# Patient Record
Sex: Male | Born: 1955 | Race: White | Hispanic: No | Marital: Single | Smoking: Never smoker
Health system: Southern US, Community
[De-identification: ages and names within clinical notes are randomized; demographics above are authoritative.]

## PROBLEM LIST (undated history)

## (undated) DIAGNOSIS — M810 Age-related osteoporosis without current pathological fracture: Secondary | ICD-10-CM

## (undated) DIAGNOSIS — J449 Chronic obstructive pulmonary disease, unspecified: Secondary | ICD-10-CM

## (undated) DIAGNOSIS — E041 Nontoxic single thyroid nodule: Secondary | ICD-10-CM

## (undated) DIAGNOSIS — I679 Cerebrovascular disease, unspecified: Secondary | ICD-10-CM

## (undated) DIAGNOSIS — N189 Chronic kidney disease, unspecified: Secondary | ICD-10-CM

## (undated) DIAGNOSIS — F32A Depression, unspecified: Secondary | ICD-10-CM

## (undated) DIAGNOSIS — F1011 Alcohol abuse, in remission: Secondary | ICD-10-CM

## (undated) DIAGNOSIS — I499 Cardiac arrhythmia, unspecified: Secondary | ICD-10-CM

## (undated) DIAGNOSIS — I10 Essential (primary) hypertension: Secondary | ICD-10-CM

## (undated) DIAGNOSIS — I517 Cardiomegaly: Secondary | ICD-10-CM

## (undated) DIAGNOSIS — E785 Hyperlipidemia, unspecified: Secondary | ICD-10-CM

## (undated) DIAGNOSIS — F319 Bipolar disorder, unspecified: Secondary | ICD-10-CM

## (undated) DIAGNOSIS — F329 Major depressive disorder, single episode, unspecified: Secondary | ICD-10-CM

## (undated) HISTORY — PX: SPLENECTOMY: SUR1306

## (undated) HISTORY — PX: SPLENECTOMY, PARTIAL: SHX787

---

## 2003-08-10 ENCOUNTER — Inpatient Hospital Stay (HOSPITAL_COMMUNITY): Admission: EM | Admit: 2003-08-10 | Discharge: 2003-09-02 | Payer: Self-pay | Admitting: Emergency Medicine

## 2003-08-17 ENCOUNTER — Encounter (INDEPENDENT_AMBULATORY_CARE_PROVIDER_SITE_OTHER): Payer: Self-pay | Admitting: *Deleted

## 2003-08-19 ENCOUNTER — Encounter (INDEPENDENT_AMBULATORY_CARE_PROVIDER_SITE_OTHER): Payer: Self-pay | Admitting: *Deleted

## 2005-11-27 ENCOUNTER — Inpatient Hospital Stay (HOSPITAL_COMMUNITY): Admission: EM | Admit: 2005-11-27 | Discharge: 2005-11-30 | Payer: Self-pay | Admitting: Emergency Medicine

## 2008-08-09 ENCOUNTER — Emergency Department (HOSPITAL_COMMUNITY): Admission: EM | Admit: 2008-08-09 | Discharge: 2008-08-09 | Payer: Self-pay | Admitting: Emergency Medicine

## 2010-10-06 NOTE — H&P (Signed)
NAME:  Vincent Stephenson, Vincent Stephenson NO.:  [id number]   MEDICAL RECORD NO.:  [id number]          PATIENT TYPE:  EMS   LOCATION:  MAJO                         FACILITY:  [facility]   PHYSICIAN:  Vincent Stephenson, M.D.       DATE OF BIRTH:  1956-03-20   DATE OF ADMISSION:  11/27/2005  DATE OF DISCHARGE:                                HISTORY & PHYSICAL   PRIMARY CARE PHYSICIAN:  Vincent Stephenson.   CHIEF COMPLAINTS:  Fall.   HISTORY OF PRESENT ILLNESS:  Vincent Stephenson is a 55 year old gentleman with  alcohol induced dementia who has been residing in a skilled nursing facility  since 2005.  In 2005, he was found outside in the cold and was found to have  a gangrenous toe secondary to frostbite.  After that hospitalization, the  patient was declared mentally incompetent and Vincent Stephenson, a Arts development officer with the [facility] [employer] was declared the patient's  guardian.  Vincent Stephenson has been residing at [facility] ever since  that time of discharge.  Apparently, Vincent Stephenson was on a leave of absence  last night and went outside and drank heavily alcohol and had a fall.  The  patient, himself, does not remember the exact circumstances, but shortly  after the fall, he realized that he cannot move his left lower extremity so  he called for help.  He was brought to the emergency room.  Here in the  emergency room, he was diagnosed with a left hip fracture, but at the same  time he was found to be in atrial fibrillation with rapid ventricular  response and we were called to admit the patient.  Vincent Stephenson carries a  history of atrial fibrillation which was diagnosed in 2005 at the time when  he had his amputations.  I think after that admission, the patient had a  visit at [facility] by Vincent Stephenson at which he was documented to be  in normal sinus rhythm.  And actually in effect, the patient's digoxin was  discontinued at the skilled nursing facility.   PAST MEDICAL  HISTORY:  1.  Alcoholism.  2.  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  3.  History of atrial fibrillation, unclear about chronicity.  4.  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  5.  Homelessness.  6.  The patient is a ward of the state of [place].   SOCIAL HISTORY:  The patient reports that he has a brother in [place]  and both his parents are dead.  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  He reports a massive intake of  alcohol, but once again, all the history he has given, we are not 100% sure  if it is real or it is just confabulation.   REVIEW OF SYSTEMS:  He currently only reports the left hip pain, but denies  any chest pain and denies any shortness of breath.  Denies  any chest pain or  shortness of breath, any nausea, vomiting or abdominal pain.   MEDICATIONS:  Medications at the skilled nursing facility are:  1.  Aspirin 81 mg daily.  2.  Vitamin B1 at 100 mg daily.  3.  Effexor XR 75 mg daily.  4.  Folic acid 1 mg daily.  5.  Metoprolol 25 mg every 12 hours.  6.  Lipitor 10 mg daily.   PHYSICAL EXAMINATION:  VITAL SIGNS:  Temperature 97.3, Stephenson pressure  141/80, pulse 108, respirations 22, oxygen saturation 90% on room air.  GENERAL APPEARANCE:  Well-developed, well-nourished gentleman in no acute  distress, alert and oriented to place and situation, not oriented to time.  HEENT:  His head is normocephalic, atraumatic.  EYES:  Pupils equal, round, and reactive to light and accommodation.  Extraocular movements intact.  THROAT:  Throat is clear.  NECK:  Supple.  No JVD.  No carotid bruits.  CHEST:  Clear to auscultation bilaterally without wheezes, rhonchi or  crackles.  HEART:  Irregularly irregular, slightly tachycardiac.  No murmurs, rubs or  gallops.  ABDOMEN:  The patient's abdomen is soft, nontender, nondistended.  Bowel   sounds are present.  LOWER EXTREMITIES:  No edema.  Knee amputation sites are well-healed.  NEUROLOGIC:  Neurological exam, I did not walk this patient, but he appears  to have intact strength in all of 3 extremities.  Left lower extremity, he  cannot move.   LABORATORY DATA:  On admission, an x-ray of the patient's left hip reveals a  subcapital fracture. The patient's sodium is 133, potassium 3.9, chloride  99, bicarb 18, glucose 106, BUN 18, creatinine 0.8, calcium 9.2, total  protein 6.6, albumin 3.7, AST 27 ALT 28, alkaline phosphatase 69, total  bilirubin 0.5, white Stephenson cell count 26,000, hemoglobin 14.8, hematocrit  42.8, platelet count 284.  PT is 13.1, INR is 1.0, and alcohol level is 27.  EKG shows atrial fibrillation with rapid ventricular response.  No ST-T  changes.   ASSESSMENT/PLAN:  1.  Atrial fibrillation with rapid ventricular response, most likely      secondary to alcohol binge.  We will also have to rule out an acute      coronary event.  We will admit the patient to telemetry unit, obtain 3      sets of cardiac enzymes, monitor his EKG closely.  Also obtain a TSH to      rule out hyperthyroidism.  Also, we will check a magnesium level to make      sure this patient with chronic alcoholism does not have hypomagnesemia.      We will control the patient's rate with a Cardizem drip and switching to      oral Cardizem when he is able to take consistently oral medications.  If      the patient does not have an acute coronary syndrome, he should be able      to withstand surgery tomorrow for his hip fracture.  2.  Alcohol-related dementia.  The patient will be continued on thiamine,      folate and Effexor without any changes.  3.  Deep venous thrombosis prophylaxis.  This will be done using Lovenox,      gastrointestinal prophylaxis will be done using Protonix.  4.  Mild leukocytosis most likely secondary to acute demargination.  The     patient is afebrile and does  not have any obviously identifiable      infection.  We  will monitor him closely and will not use any antibiotics      for now.      Vincent Stephenson, M.D.  Electronically Signed     SL/MEDQ  D:  11/27/2005  T:  11/27/2005  Job:  [id number]   cc:   Maxwell Caul, M.D.  Fax: [phone]

## 2010-10-06 NOTE — Discharge Summary (Signed)
NAME:  Vincent Stephenson, Vincent Stephenson                         ACCOUNT NO.:  [id number]   MEDICAL RECORD NO.:  [id number]                   PATIENT TYPE:  INP   LOCATION:  0380                                 FACILITY:  [facility]   PHYSICIAN:  Kela Millin, M.D.             DATE OF BIRTH:  1955-10-03   DATE OF ADMISSION:  08/10/2003  DATE OF DISCHARGE:  09/02/2003                                 DISCHARGE SUMMARY   DISCHARGE DIAGNOSES:  1. 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  2. Atrial fibrillation.  3. Dementia not otherwise specified.  4. Alcohol dependence.  5. Hypokalemia, resolved.  6. Hyponatremia, resolved.   PROCEDURES/STUDIES:  1. Status post amputation of the right second toe and left second toe per     surgery.  2. ABIs.  Right ABI not ascertained secondary to calcification of vessel.     Left ABI:  Normal arterial flow.  3. Lower extremity Dopplers, negative for DVT.  4. Echocardiogram, EF 55-65%, normal LV function, left atrial size upper     limits of normal, mild valvular regurgitation.   HISTORY:  The patient is a 55 year old white male who presented with a  several-day history of black toes.  He denied any antecedent traumatic  injury.  The patient denied any history of diabetes mellitus or  vasculopathy.  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  He was  unable to categorically state how long this had been going on.  He denied  any history of fevers, chills, cough, shortness of breath, chest pain,  nausea, vomiting, diarrhea, melena, hematochezia, dysuria, or headaches.  He  also denied complaints of micturition, blackouts, decreased strength in  lower extremities, or any sensory loss.  Vincent Stephenson also denied pruritus and  any previous skin injury at the time of presentation.  He admitted at that  time to pain in his feet in addition to the discoloration as noted above.   Upon admission, his physical exam, the blood pressure was 135/100 with a  pulse of 78, a respiratory rate of 20, and a temperature of 99.  He appeared  cachectic with bitemporal wasting and was disheveled in appearance.  He was  not in any acute distress.  Other pertinent findings on his exam included on  his lower extremities, he had extensive necrotic tissue on his second toes  bilaterally with necrotic involvement of the plantar aspect of the right big  toe.  It also was noted that his feet were erythematous with +1 bilateral  pitting edema and were warm to touch, slightly tender to palpation.  The  dorsalis pedis pulses were decreased.  He was also  noted to be alert and  oriented x3.   His white cell count at that time was 7.7, his hemoglobin 15.3, hematocrit  44.7, and the platelet count 251.  His sodium was 130, potassium 3.3,  glucose 106, chloride of 91, CO2 of 29, BUN of 17, creatinine of 1.2, total  bilirubin of 1.7, alkaline phosphatase of 55, SGOT 25, SGPT 15, total  protein 6.9, albumin 3.1, with a calcium of 9.9.   HOSPITAL COURSE:  Upon admission the patient was started on IV antibiotics  and was also started on thiamine and folate as well as p.r.n. Ativan and was  monitored for alcohol withdrawal.  He was hydrated with normal saline IV,  and his hyponatremia was thought to be secondary to volume depletion versus  excessive alcohol intake.  While in the hospital Vincent Stephenson developed fevers  and blood culture as well as urine cultures were done, and these were  negative.  Surgery was consulted on admission, and an ABI and lower  extremity Dopplers were done, and the results are as stated above.  While  the patient was awaiting amputation, he developed atrial fibrillation and  workup was done including an echocardiogram, and  the results are as stated  above.  He was started on digoxin as well as metoprolol for rate control.  Cardiac enzymes were done as part of the workup, and these were negative.  A  TSH was also done and was 2.43, which is within normal limits.  His total  cholesterol was also done, and it was 102.  The patient was later started on  aspirin, and it was noted that he was not a good candidate for chronic  anticoagulation with Coumadin.  After the patient was stabilized and his  heart rate was controlled, the patient was scheduled for surgery.  Initially  he refused but later consented to surgery, and it was done on August 19, 2003.  The patient's wound care was done by surgery and consists of daily  wet-to-dry with Accuzyme and dressing changes.  The patient is medically  stable at this time.  I have discussed with Dr. Lurene Shadow, the surgeon, and the  sutures will be removed today and he agrees with discharging the patient to  a skilled nursing facility, to follow up with him in two to three weeks.  The dressing changes to be continued on a daily basis.   On admission it was noted that the patient was homeless, and care management  was consulted for discharge planning.  Psychiatry was also consulted to  evaluate the patient's competence, and the psychiatrist stated that he had  impaired insight and did not have the capacity for informed consent.  He was  also diagnosed with a dementia NOS.  Based on this and given the patient's  clinical condition, [facility] was granted interim guardianship until a  court hearing that is scheduled for May 18.  The patient was also noted to  have anemia while in the hospital, and workup was done.  The iron 21, TIBC  of 121, and percent saturation of 17.  His B12 level was 727, and his folate  level 13.2, and his ferritin 1108.  The patient's stool guaiacs were  negative.  The patient was started on iron in the hospital.  The anemia was normocytic anemia.  The  patient has remained hemodynamically stable, he is  afebrile, his heart rate is controlled, and he is medically stable for  discharge at this time.  He is to follow up with the nursing home physician  in two to three days.   DISCHARGE MEDICATIONS:  1. Enteric-coated aspirin 81 mg p.o. daily.  2. Cipro 500 mg p.o. q.12h. x4 days.  3. Digoxin 0.25 mg p.o. daily.  4. Iron sulfate 325 mg p.o. b.i.d.  5. Folic acid 1 mg p.o. b.i.d.  6. Metoprolol 12.5 mg p.o. q.12h.  7. Thiamine 100 mg p.o. daily.  8. Ativan 0.5 mg p.o. t.i.d.  9. Percocet one to two tablets p.o. q.4-6h. p.r.n.   DISCHARGE DIET:  Regular.   CONDITION ON DISCHARGE:  Improved.   WOUND CARE:  Daily wet-to-dry dressing and Accuzyme, gauze between toes, and  Kerlix gauze wraps daily.   FOLLOW-UP CARE:  1. Nursing home physician in two to three days.  2. Dr. Lurene Shadow at his office in two to three weeks.                                               Kela Millin, M.D.    ACV/MEDQ  D:  09/02/2003  T:  09/02/2003  Job:  [id number]   cc:   Leonie Man, M.D.  [address]  [address]  [place]  [place] [address]  Fax: [phone]

## 2010-10-06 NOTE — Discharge Summary (Signed)
NAME:  Vincent Stephenson, Vincent Stephenson NO.:  [id number]   MEDICAL RECORD NO.:  [id number]          PATIENT TYPE:  INP   LOCATION:  3708                         FACILITY:  [facility]   PHYSICIAN:  Hillery Aldo, M.D.   DATE OF BIRTH:  1956/01/24   DATE OF ADMISSION:  11/27/2005  DATE OF DISCHARGE:                                 DISCHARGE SUMMARY   DATE OF ADMISSION:  November 27, 2005.   DATE OF DISCHARGE:  November 30, 2005.   PRIMARY CARE PHYSICIAN:  Maxwell Caul, M.D.   DISCHARGE DIAGNOSES:  1.  Displaced left femoral neck fracture secondary to fall status post      orthopedic closed reduction and cannulated screw fixation by Dr. Otelia Sergeant.  2.  Bilateral foot frostbite injuries that are old status post deletion of      second toes bilaterally with severe bunion deformities.  3.  Atrial fibrillation with rapid ventricular response.  4.  Alcohol abuse.  5.  Acute blood loss anemia after surgery.  6.  History of dementia.   DISCHARGE MEDICATIONS:  1.  Effexor 75 mg daily.  2.  Robaxin 500 mg every 8 hours.  3.  Lipitor 10 mg daily.  4.  Folic acid 1 mg daily.  5.  Thiamin 100 mg daily.  6.  Metoprolol 25 mg every 12 hours.  7.  Lovenox 40 mg subcutaneously daily.  8.  Aspirin 81 mg daily.   CONSULTATIONS:  Dr. Otelia Sergeant of Orthopedic Surgery.   PROCEDURES AND DIAGNOSTIC STUDIES:  1.  Left femur films on November 27, 2005 showed a subcapital left femur      fracture.  2.  Chest x-ray on November 27, 2005 showed no acute cardiopulmonary findings.  3.  Left hip postoperative film on November 27, 2005 showed internal fixation of      the left femoral neck fracture with 4 cannulated hip screws.   DISCHARGE LABORATORY VALUES:  CBC showed a white blood cell count of 16.7,  hemoglobin 10.2, hematocrit 29.9, platelet count 230.  Sodium was 139,  potassium 3.9, chloride 106, bicarb 26, BUN 17, creatinine 0.8, glucose 99.  TSH was 1.253.   BRIEF ADMISSION HPI:  The patient is a 55 year old male who  has a long-  standing history of alcohol-induced dementia.  He is a ward of the state.  Patient left his nursing facility and had an alcohol binge resulting in a  fall.  He was transported to the emergency department for further evaluation  and workup where he was found to have an acute left hip fracture.  He was  also found to be in atrial fibrillation with rapid ventricular response.  He  was admitted for orthopedic consultation and medical stabilization.   HOSPITAL COURSE BY PROBLEM:  1.  Left hip fracture:  Patient was seen in consultation with Dr. Otelia Sergeant who      took the patient to surgery and performed a closed reduction.  Patient      remained stable postoperatively.  He will need ongoing physical therapy      and rehabilitation.  2.  Atrial fibrillation with rapid ventricular response:  Patient has a      history of atrial fibrillation dating back to 2005.  He has not been on      any chronic anticoagulation and given his propensity for falls, long-      term anticoagulation is probably not a realistic treatment option for      this patient.  Nevertheless, his atrial fibrillation was rate controlled      with a Cardizem drip which has been gradually titrated off.  His rate is      now controlled on his usual dose of beta blocker therapy.  We will      discharge him on prophylactic dose of Lovenox and the decision as to      whether to chronically anticoagulate him can be made by his primary care      physician.  3.  Alcohol abuse:  The patient was monitored closely for signs of      withdrawal.  Ativan was used on a p.r.n. basis for any agitation.  He      continued to be supplemented with thiamin and folic acid.  4.  Acute blood loss anemia:  Patient did have a drop in his hemoglobin      after surgery from a high of 14.8 to its current level of 10.2.  He is      hemodynamically stable otherwise and his hemoglobin should be monitored      periodically for return to baseline.   5.  Leukocytosis.  Patient's leukocytosis was thought be secondary to      demargination from his acute alcohol binge.  His white blood cell count      has trended down over the course of his hospitalization from a high of      26,000 to the current level of 16,700.   DISPOSITION:  Patient is stable for discharge back to [facility].  He should receive ongoing physical therapy.  Again, the decision  regarding chronic anticoagulation needs to be considered and addressed by  his primary care physician, Dr. Leanord Hawking.      Hillery Aldo, M.D.  Electronically Signed     CR/MEDQ  D:  11/30/2005  T:  11/30/2005  Job:  [id number]   cc:   Maxwell Caul, M.D.  Fax: [phone]

## 2010-10-06 NOTE — Op Note (Signed)
NAME:  ALIAS, VILLAGRAN NO.:  [id number]   MEDICAL RECORD NO.:  [id number]          PATIENT TYPE:  INP   LOCATION:  3708                         FACILITY:  [facility]   PHYSICIAN:  Kerrin Champagne, M.D.   DATE OF BIRTH:  31-Jul-1955   DATE OF PROCEDURE:  11/27/2005  DATE OF DISCHARGE:                                 OPERATIVE REPORT   PREOPERATIVE DIAGNOSIS:  Left displaced femoral neck fracture a garden type  4.   POSTOPERATIVE DIAGNOSIS:  Same.   PROCEDURE:  Closed reduction and cannulated screw fixation of the left  displaced femoral neck fracture.   SURGEON:  Kerrin Champagne, M.D.   ANESTHESIA:  GOT Dr. Arta Bruce.   ESTIMATED BLOOD LOSS:  50 cc.   DRAINS:  Foley to straight drain.   BRIEF CLINICAL HISTORY:  The patient is a 55 year old male, ward of the  state.  He is living at [facility]  He apparently was made a  ward of the state some two years ago after admission for gangrenous frost  bite deformities and changes of both feet in an obtunded state.  He is an  ambulator within the nursing center.  Apparently, he had drank alcohol  this  morning early and fell this morning at about 9:00 or 9:30 a.m. sustaining a  left hip fracture.  He was seen in the emergency room at [facility].  X-rays taken demonstrated a displaced left femoral neck fracture.  He is brought to the operating room within the eight hour window to undergo  a closed reduction and internal fixation of the left displaced femoral neck  fracture.   INTRAOPERATIVE FINDINGS:  As described.  The fracture was displaced with the  femoral neck distal fragment, displaced superiorly and laterally on the head  and proximal neck.  The fracture was reduced with gentle longitudinal  traction and internal rotation, abduction and adduction in order to decrease  the degree of valgus and then relaxing on traction to allow for impaction of  the fracture site.   DESCRIPTION OF  PROCEDURE:  After adequate general anesthesia, the patient  had a Foley catheter placed without difficulty.  He was transferred to the  [name] fracture table.  The right leg was placed in the well leg holder.  There was a groin post on the left side.  Left leg was placed in  longitudinal traction.  The heel well padded and in the left boot for the  left foot.  The leg placed in a very mild degree of longitudinal traction,  abduction with external rotation and then internally rotated and brought  into adduction.  C-arm fluoro was used to ascertain the reduction and the  reduction appeared to be nearly anatomic.  It did require, however, further  internal rotation forced in order to fully reduce the femoral neck fracture  site aligning both the anterior and posterior edges of the fracture site in  the anatomic positional alignment.  Once this was completed, then the  patient's lateral thigh and hip area were then prepped with DuraPrep  solution.  He was draped in the usual manner.  Iodine exclusion of eye drape  was  used.  He received pre-operative antibiotics of Ancef one hour prior to  surgery.  A Kelly clamp used over the lateral hip to assess the area for  incision just distal to the slope of the greater trochanter.  Incision made  in the skin and extended distally over about 2.5 to 3 inches through the  skin and subcutaneous layers down to the tensor fascia lata.  Tensor fascia  lata was then incised using electrocautery and a cerebellar retractor  placed.  The vastus lateralis muscle was then incised and lined with the  previous skin incision only posterior to it and a cob then used to carefully  spread the vastus lateralis muscles down to the lateral aspect of the femur.  Exposure of the lateral portion of the femur to the area at the crest of the  greater trochanter was then performed.  A smaller Weitlaner was then  inserted deep in order to retract and expose the lateral aspect of  the  femur.  A guide set at 125 to 130 degrees was then placed opposite the  lesser trochanter and carefully guide pins for the Ace 6.25 cannula screws  were then placed into the three quadrants of a triangle.  The anterior  superior pin was first placed just below the lateral margin of the femoral  neck by at least 2 or 3 mm.  This was then followed by a similar pin  distally after placing a posterior pin parallel to the first and completing  a triangle of guide pins.  Each of these were placed near subchondral bone  and observed in the AP and lateral planes to be in good position and  alignment.  Finally, a fourth screw was placed in the central area of this  triangle of screws.  Once this was completed, then beginning with the most  superior and anterior screw these were measured for depth and appropriate  size screws placed obtaining excellent purchase and excellent compression  across the fracture site over all four screws.  After completing the  anterior as well as the most caudal screw and then the posterior screw and  then the central screw, it was felt that each of the screws were in  excellent position and alignment.  AP & lateral radiographs were obtained as  well as oblique to ensure that the fracture was well reduced and there was  no sign of penetration of any screw within the hip joint line.  Irrigation  was then carried out.  Permanent C-arm images obtained in AP & lateral  planes.  The vastus lateralis muscles did not show any sign of bleeders or  no significant perforating vessel and bleeding occurring.  The superficial  fascial area of the vastus lateralis was approximated with a running stitch  of 0 Vicryl.  Tensor fascia lata was approximated with interrupted #1 Vicryl  sutures as well as running stitch.  Subcutaneous layers were approximated  with interrupted 0 and then 2-0 Vicryl sutures and the skin closed with stainless steel staples.  4 x 4s and ABD pad fixed to  the skin with hypo  fixed tape.  The patient then had legs brought down from the well leg holder  as well as out of longitudinal traction.  The patient was then returned to  his bed, reactivated, extubated and returned to the recovery room in  satisfactory condition.  All instrument and  sponge counts were correct.      Kerrin Champagne, M.D.  Electronically Signed     JEN/MEDQ  D:  11/27/2005  T:  11/28/2005  Job:  [id number]
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
## 2010-10-06 NOTE — Consult Note (Signed)
NAME:  Vincent Stephenson, Vincent Stephenson NO.:  [id number]   MEDICAL RECORD NO.:  [id number]          PATIENT TYPE:  INP   LOCATION:  1828                         FACILITY:  [facility]   PHYSICIAN:  Kerrin Champagne, M.D.   DATE OF BIRTH:  1955-08-11   DATE OF CONSULTATION:  DATE OF DISCHARGE:                                   CONSULTATION   REQUESTING PHYSICIAN:  Dr. Gordy Levan emergency department.   ORTHOPEDIC DIAGNOSES:  1.  Left displaced transcervical hip fracture, Garden IV injury.  2.  History of bilateral second toe amputation for frost bite injuries two      years ago.  3.  Right proximal femur intramedullary nail fixation.   CHIEF COMPLAINT:  Left hip pain.   HISTORY OF PRESENT ILLNESS:  A 55 year old male fell this morning at about 9  a.m.  It is not quite clear when he fell, but he believes he fell at about  9:30 a.m.  Another roommate had indicated that he felt like he had fallen at  3:00 a.m.  The patient is a 55 year old individual who is a self-ambulator.   DICTATION ENDED AT THIS POINT.      Kerrin Champagne, M.D.  Electronically Signed     JEN/MEDQ  D:  11/27/2005  T:  11/27/2005  Job:  [id number]
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
## 2010-10-06 NOTE — H&P (Signed)
NAME:  Vincent Stephenson, Vincent Stephenson                         ACCOUNT NO.:  [id number]   MEDICAL RECORD NO.:  [id number]                   PATIENT TYPE:  EMS   LOCATION:  ED                                   FACILITY:  [facility]   PHYSICIAN:  Jackie Plum, M.D.             DATE OF BIRTH:  [birth year]   DATE OF ADMISSION:  08/10/2003  DATE OF DISCHARGE:                                HISTORY & PHYSICAL   CHIEF COMPLAINT:  Black toes.   HISTORY:  The patient presents with a several day history of black toes. He  denies any antecedent traumatic injury. The patient denies any history of  diabetes mellitus, or vasculopathy. 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 He denies any  history of fever, chills, cough, shortness of breath, chest pain, nausea,  vomiting, and diarrhea, black or bloody stools, dysuria or complaints of  micturition, headaches, blackout, loss of power in his extremities, or any  sensory losses. He denies any pruritus or any previous skin injury. Admits  to some pain in his feet in addition to discoloration as noted above.   PAST MEDICAL HISTORY:  The patient denies any history of diabetes mellitus,  heart disease, or vascular disease.   FAMILY HISTORY:  Positive for diabetes.   SOCIAL HISTORY:  He does not have his own doctor. He lives in his own  apartment and said he drinks a lot of alcohol, but denies any cigarette  smoking or illicit drug use.   MEDICATIONS:  He is not on any medications.   ALLERGIES:  No known drug allergies.   REVIEW OF SYSTEMS:  Significant points and negatives are as per HPI, but  otherwise the review of systems was unremarkable.   PHYSICAL EXAMINATION:  VITAL SIGNS:  Blood pressure 135/100 mmHg with a  pulse of 78, respiratory rate 20, temperature 99.3  degrees Fahrenheit  rectal.  GENERAL:  Notable for a cachectic looking Caucasian gentleman with  bitemporal recession, disheveled in appearance. Was not in acute distress.  HEENT:  Normocephalic, atraumatic. Pupils equal, round, and reactive to  light. Extraocular movements were intact. Oropharynx was moist.  NECK:  Supple. No JVD.  LUNGS: There were vesicular breath sounds without any significant  adventitious sounds.  ABDOMEN:  Soft, bowel sounds were present. They were normoactive. Nontender.  CNS:  Alert and oriented x3.  EXTREMITIES:  He had extensive necrotic tissue on his second toes  bilaterally with necrotic involvement of the plantar aspect of his right big  toe. It does seem his feet are erythematous with 1+ bilateral pitting edema.  Warm to touch. Slightly tender to palpation. He has some __________  lesions  on both dorsum of his feet. Dorsalis  pedis pulses were decreased.   LABORATORY DATA:  The patient's CBC was essentially within normal limits.  His CMET notable for a sodium 130, potassium 3.3, glucose 106, chloride 91,  CO2 of 29, BUN 17, creatinine 1.2. Total bilirubin 1.7. Alkaline phosphatase  55, SGOT 25, SGPT 15, total protein 6.9, albumin 3.1, calcium 9.9.   IMPRESSION:  1. Bilateral gangrenous feet, dry gangrene.  2. Hypokalemia and hyponatremia.  3. Mild hyperbilirubinemia with normal transaminases.   PLAN:  The patient was called by the ED physician, Dr. Estell Harpin, who requested  that we admit the patient due to the patient's having a significant medical  history, however, we have opted to do so. I have called Dr. Magnus Ivan,  myself, again at the ED and he is going to see the patient. The patient may  have underlying peripheral vascular disease. He denies any history of  diabetes and his glucose is not very high. We will go ahead and obtain  magnesium and phosphorus and hemoglobin A1C. He should be watched carefully  for alcohol withdrawal because he seemed to  be taking a lot of alcohol. We  will get a baseline fasting lipid level. Obtain x-ray of his hips. Check  drug screen and x-ray of his chest in view of his significant weight loss.  We will start him on Thiamine, folate, repeat his potassium, give him some  supportive measures including pain medications and anti-emetics as needed.  We will start him on IV Ancef for now. His blood pressure will be repeated  and appropriate measures taken as needed. The patient may need a vascular  surgery evaluation, however, will defer that until evaluation by general  surgery.                                               Jackie Plum, M.D.    GO/MEDQ  D:  08/10/2003  T:  08/10/2003  Job:  [id number]   cc:   Abigail Miyamoto, M.D.  [address]  [place]  [place] [address]  Fax: [phone]

## 2010-12-07 ENCOUNTER — Emergency Department (HOSPITAL_COMMUNITY)
Admission: EM | Admit: 2010-12-07 | Discharge: 2010-12-08 | Disposition: A | Discharge status: Home / Self Care | Payer: Medicare Other | Attending: Emergency Medicine | Admitting: Emergency Medicine

## 2010-12-07 DIAGNOSIS — F29 Unspecified psychosis not due to a substance or known physiological condition: Secondary | ICD-10-CM | POA: Insufficient documentation

## 2010-12-07 DIAGNOSIS — G309 Alzheimer's disease, unspecified: Secondary | ICD-10-CM | POA: Insufficient documentation

## 2010-12-07 DIAGNOSIS — F0280 Dementia in other diseases classified elsewhere without behavioral disturbance: Secondary | ICD-10-CM | POA: Insufficient documentation

## 2010-12-07 DIAGNOSIS — I4891 Unspecified atrial fibrillation: Secondary | ICD-10-CM | POA: Insufficient documentation

## 2010-12-07 DIAGNOSIS — Z79899 Other long term (current) drug therapy: Secondary | ICD-10-CM | POA: Insufficient documentation

## 2010-12-07 DIAGNOSIS — E7800 Pure hypercholesterolemia, unspecified: Secondary | ICD-10-CM | POA: Insufficient documentation

## 2010-12-07 DIAGNOSIS — I10 Essential (primary) hypertension: Secondary | ICD-10-CM | POA: Insufficient documentation

## 2010-12-07 LAB — COMPREHENSIVE METABOLIC PANEL
ALT: 15 U/L (ref 0–53)
Alkaline Phosphatase: 80 U/L (ref 39–117)
BUN: 13 mg/dL (ref 6–23)
CO2: 24 mEq/L (ref 19–32)
GFR calc Af Amer: >60 mL/min (ref >60)
GFR calc non Af Amer: >60 mL/min (ref >60)
Glucose, Bld: 102 mg/dL — ABNORMAL HIGH (ref 70–99)
Potassium: 4.4 mEq/L (ref 3.5–5.1)
Sodium: 135 mEq/L (ref 135–145)
Total Bilirubin: 0.6 mg/dL (ref 0.3–1.2)
Total Protein: 8.2 g/dL (ref 6.0–8.3)

## 2010-12-07 LAB — RAPID URINE DRUG SCREEN, HOSP PERFORMED
Barbiturates: NOT DETECTED
Benzodiazepines: NOT DETECTED

## 2010-12-07 LAB — DIFFERENTIAL
Basophils Absolute: 0.1 10*3/uL (ref 0.0–0.1)
Lymphocytes Relative: 20 % (ref 12–46)
Monocytes Absolute: 1.3 10*3/uL — ABNORMAL HIGH (ref 0.1–1.0)
Monocytes Relative: 11 % (ref 3–12)
Neutro Abs: 7.5 10*3/uL (ref 1.7–7.7)
Neutrophils Relative %: 64 % (ref 43–77)

## 2010-12-07 LAB — ETHANOL: Alcohol, Ethyl (B): <11 mg/dL (ref 0–11)

## 2010-12-07 LAB — CBC
HCT: 46 % (ref 39.0–52.0)
Hemoglobin: 15.2 g/dL (ref 13.0–17.0)
MCHC: 33 g/dL (ref 30.0–36.0)
RBC: 4.8 MIL/uL (ref 4.22–5.81)

## 2010-12-08 LAB — PROTIME-INR
INR: 1.08 (ref 0.00–1.49)
Prothrombin Time: 14.2 seconds (ref 11.6–15.2)

## 2011-07-23 ENCOUNTER — Ambulatory Visit: Payer: Self-pay | Admitting: Cardiovascular Disease

## 2017-08-05 ENCOUNTER — Encounter: Payer: Self-pay | Admitting: *Deleted

## 2017-08-06 ENCOUNTER — Encounter: Payer: Self-pay | Admitting: Anesthesiology

## 2017-08-06 ENCOUNTER — Encounter
Admission: RE | Disposition: A | Discharge status: Home / Self Care | Payer: Self-pay | Source: Ambulatory Visit | Attending: Gastroenterology

## 2017-08-06 ENCOUNTER — Ambulatory Visit
Admission: RE | Admit: 2017-08-06 | Discharge: 2017-08-06 | Disposition: A | Discharge status: Home / Self Care | Payer: Medicare Other | Source: Ambulatory Visit | Attending: Gastroenterology | Admitting: Gastroenterology

## 2017-08-06 DIAGNOSIS — Z5309 Procedure and treatment not carried out because of other contraindication: Secondary | ICD-10-CM | POA: Diagnosis not present

## 2017-08-06 DIAGNOSIS — Z1211 Encounter for screening for malignant neoplasm of colon: Secondary | ICD-10-CM | POA: Insufficient documentation

## 2017-08-06 HISTORY — DX: Age-related osteoporosis without current pathological fracture: M81.0

## 2017-08-06 HISTORY — DX: Bipolar disorder, unspecified: F31.9

## 2017-08-06 HISTORY — DX: Cardiomegaly: I51.7

## 2017-08-06 HISTORY — DX: Cerebrovascular disease, unspecified: I67.9

## 2017-08-06 HISTORY — DX: Alcohol abuse, in remission: F10.11

## 2017-08-06 HISTORY — DX: Major depressive disorder, single episode, unspecified: F32.9

## 2017-08-06 HISTORY — DX: Nontoxic single thyroid nodule: E04.1

## 2017-08-06 HISTORY — DX: Chronic kidney disease, unspecified: N18.9

## 2017-08-06 HISTORY — DX: Depression, unspecified: F32.A

## 2017-08-06 HISTORY — DX: Hyperlipidemia, unspecified: E78.5

## 2017-08-06 HISTORY — DX: Essential (primary) hypertension: I10

## 2017-08-06 HISTORY — DX: Chronic obstructive pulmonary disease, unspecified: J44.9

## 2017-08-06 HISTORY — DX: Cardiac arrhythmia, unspecified: I49.9

## 2017-08-06 SURGERY — COLONOSCOPY WITH PROPOFOL
Anesthesia: General

## 2017-08-06 MED ORDER — FENTANYL CITRATE (PF) 100 MCG/2ML IJ SOLN
INTRAMUSCULAR | Status: AC
  Filled 2017-08-06: qty 2

## 2017-08-06 NOTE — Anesthesia Preprocedure Evaluation (Deleted)
Anesthesia Evaluation  Patient identified by MRN, date of birth, ID band Patient awake    Reviewed: Allergy & Precautions, NPO status , Patient's Chart, lab work & pertinent test results, reviewed documented beta blocker date and time   Airway Mallampati: II  TM Distance: >3 FB     Dental  (+) Chipped   Pulmonary COPD,           Cardiovascular hypertension, Pt. on medications + dysrhythmias      Neuro/Psych PSYCHIATRIC DISORDERS Depression Bipolar Disorder    GI/Hepatic   Endo/Other    Renal/GU Renal disease     Musculoskeletal   Abdominal   Peds  Hematology   Anesthesia Other Findings   Reproductive/Obstetrics                             Anesthesia Physical Anesthesia Plan  ASA: III  Anesthesia Plan: General   Post-op Pain Management:    Induction: Intravenous  PONV Risk Score and Plan:   Airway Management Planned:   Additional Equipment:   Intra-op Plan:   Post-operative Plan:   Informed Consent: I have reviewed the patients History and Physical, chart, labs and discussed the procedure including the risks, benefits and alternatives for the proposed anesthesia with the patient or authorized representative who has indicated his/her understanding and acceptance.     Plan Discussed with: CRNA  Anesthesia Plan Comments:         Anesthesia Quick Evaluation

## 2017-08-06 NOTE — OR Nursing (Signed)
Pt's care giver Diamond Nickel reports patient took all of his medications yesterday including his Pradaxa.  Dr. Gustavo Lah informed of this, he canceled today's case and office will call group home to reschedule colonoscopy.  Reminded Nicole Kindred to ask when Pradaxa must be stopped prior to upcoming colonoscopy.

## 2017-12-12 ENCOUNTER — Other Ambulatory Visit: Payer: Self-pay

## 2017-12-12 ENCOUNTER — Encounter
Admission: RE | Disposition: A | Discharge status: Home / Self Care | Payer: Self-pay | Source: Ambulatory Visit | Attending: Gastroenterology

## 2017-12-12 ENCOUNTER — Encounter: Payer: Self-pay | Admitting: *Deleted

## 2017-12-12 ENCOUNTER — Ambulatory Visit: Payer: Medicare Other | Admitting: Certified Registered"

## 2017-12-12 ENCOUNTER — Ambulatory Visit
Admission: RE | Admit: 2017-12-12 | Discharge: 2017-12-12 | Disposition: A | Discharge status: Home / Self Care | Payer: Medicare Other | Source: Ambulatory Visit | Attending: Gastroenterology | Admitting: Gastroenterology

## 2017-12-12 DIAGNOSIS — Z7901 Long term (current) use of anticoagulants: Secondary | ICD-10-CM | POA: Diagnosis not present

## 2017-12-12 DIAGNOSIS — J449 Chronic obstructive pulmonary disease, unspecified: Secondary | ICD-10-CM | POA: Insufficient documentation

## 2017-12-12 DIAGNOSIS — M810 Age-related osteoporosis without current pathological fracture: Secondary | ICD-10-CM | POA: Diagnosis not present

## 2017-12-12 DIAGNOSIS — E785 Hyperlipidemia, unspecified: Secondary | ICD-10-CM | POA: Diagnosis not present

## 2017-12-12 DIAGNOSIS — F319 Bipolar disorder, unspecified: Secondary | ICD-10-CM | POA: Insufficient documentation

## 2017-12-12 DIAGNOSIS — D122 Benign neoplasm of ascending colon: Secondary | ICD-10-CM | POA: Diagnosis not present

## 2017-12-12 DIAGNOSIS — I4891 Unspecified atrial fibrillation: Secondary | ICD-10-CM | POA: Diagnosis not present

## 2017-12-12 DIAGNOSIS — Z79899 Other long term (current) drug therapy: Secondary | ICD-10-CM | POA: Diagnosis not present

## 2017-12-12 DIAGNOSIS — I10 Essential (primary) hypertension: Secondary | ICD-10-CM | POA: Insufficient documentation

## 2017-12-12 DIAGNOSIS — Z1211 Encounter for screening for malignant neoplasm of colon: Secondary | ICD-10-CM | POA: Diagnosis not present

## 2017-12-12 DIAGNOSIS — Z9081 Acquired absence of spleen: Secondary | ICD-10-CM | POA: Insufficient documentation

## 2017-12-12 HISTORY — PX: COLONOSCOPY WITH PROPOFOL: SHX5780

## 2017-12-12 SURGERY — COLONOSCOPY WITH PROPOFOL
Anesthesia: General

## 2017-12-12 MED ORDER — PROPOFOL 10 MG/ML IV BOLUS
INTRAVENOUS | Status: AC
  Filled 2017-12-12: qty 40

## 2017-12-12 MED ORDER — PROPOFOL 500 MG/50ML IV EMUL
INTRAVENOUS | Status: DC | PRN
  Administered 2017-12-12: 75 ug/kg/min via INTRAVENOUS

## 2017-12-12 MED ORDER — LIDOCAINE HCL (CARDIAC) PF 100 MG/5ML IV SOSY
PREFILLED_SYRINGE | INTRAVENOUS | Status: DC | PRN
  Administered 2017-12-12: 80 mg via INTRAVENOUS

## 2017-12-12 MED ORDER — SODIUM CHLORIDE 0.9 % IV SOLN
INTRAVENOUS | Status: DC
  Administered 2017-12-12: 09:00:00 via INTRAVENOUS

## 2017-12-12 MED ORDER — PHENYLEPHRINE HCL 10 MG/ML IJ SOLN
INTRAMUSCULAR | Status: DC | PRN
  Administered 2017-12-12: 100 ug via INTRAVENOUS

## 2017-12-12 MED ORDER — PROPOFOL 10 MG/ML IV BOLUS
INTRAVENOUS | Status: DC | PRN
  Administered 2017-12-12: 60 mg via INTRAVENOUS
  Administered 2017-12-12: 40 mg via INTRAVENOUS

## 2017-12-12 NOTE — Anesthesia Post-op Follow-up Note (Signed)
Anesthesia QCDR form completed.        

## 2017-12-12 NOTE — Anesthesia Postprocedure Evaluation (Signed)
Anesthesia Post Note  Patient: Vincent Stephenson  Procedure(s) Performed: COLONOSCOPY WITH PROPOFOL (N/A )  Patient location during evaluation: Endoscopy Anesthesia Type: General Level of consciousness: awake and alert Pain management: pain level controlled Vital Signs Assessment: post-procedure vital signs reviewed and stable Respiratory status: spontaneous breathing, nonlabored ventilation, respiratory function stable and patient connected to nasal cannula oxygen Cardiovascular status: blood pressure returned to baseline and stable Postop Assessment: no apparent nausea or vomiting Anesthetic complications: no     Last Vitals:  Vitals:   12/12/17 0928 12/12/17 0938  BP: (!) 89/73 100/74  Pulse: 92 69  Resp: 15 15  Temp:    SpO2: 100% 100%    Last Pain:  Vitals:   12/12/17 0938  TempSrc:   PainSc: 0-No pain                 Precious Haws Piscitello

## 2017-12-12 NOTE — H&P (Signed)
Outpatient short stay form Pre-procedure 12/12/2017 8:35 AM Vincent Sails MD  Primary Physician: [facility]  Reason for visit: Colonoscopy  History of present illness: Patient is a 62 year old male presenting today as above.  He tolerated his prep well.  He does take Pradaxa which is been held for 48-72 hours. He takes no other blood thinning agents or aspirin products.   Current Facility-Administered Medications:  .  0.9 %  sodium chloride infusion, , Intravenous, Continuous, Vincent Sails, MD .  0.9 %  sodium chloride infusion, , Intravenous, Continuous, Vincent Sails, MD  Medications Prior to Admission  Medication Sig Dispense Refill Last Dose  . albuterol (PROVENTIL HFA;VENTOLIN HFA) 108 (90 Base) MCG/ACT inhaler Inhale 2 puffs into the lungs every 6 (six) hours as needed for wheezing or shortness of breath.   Past Week at Unknown time  . atorvastatin (LIPITOR) 10 MG tablet Take 10 mg by mouth daily.   12/11/2017 at 0800  . Cholecalciferol 1000 units capsule Take 1,000 Units by mouth daily.   12/11/2017 at 0800  . dabigatran (PRADAXA) 150 MG CAPS capsule Take 150 mg by mouth 2 (two) times daily.   12/08/2017 at Unknown time  . digoxin (LANOXIN) 0.125 MG tablet Take 0.125 mg by mouth daily.   12/11/2017 at 0800  . divalproex (DEPAKOTE) 250 MG DR tablet Take 250 mg by mouth 3 (three) times daily.   12/11/2017 at 0800  . donepezil (ARICEPT) 10 MG tablet Take 10 mg by mouth at bedtime.   12/11/2017 at 0800  . metoprolol tartrate (LOPRESSOR) 25 MG tablet Take 25 mg by mouth 2 (two) times daily.   12/11/2017 at 0800  . risperiDONE (RISPERDAL) 0.5 MG tablet Take 0.5 mg by mouth 2 (two) times daily.   12/11/2017 at 0800  . venlafaxine XR (EFFEXOR-XR) 150 MG 24 hr capsule Take 150 mg by mouth daily.   12/11/2017 at 0800     No Known Allergies   Past Medical History:  Diagnosis Date  . Alcohol abuse, in remission   . Bipolar disorder ([place])   . Cardiomegaly   .  Cerebrovascular disease   . Chronic kidney disease    calculous  . COPD (chronic obstructive pulmonary disease) ([place])   . Depression   . Dysrhythmia    A-fib   . Hyperlipidemia   . Hypertension   . Osteoporosis   . Thyroid nodule     Review of systems:      Physical Exam    Heart and lungs: irregular rate and rhythm  lungs are bilaterally clear    HEENT: Normocephalic atraumatic eyes are anicteric    Other:    Pertinant exam for procedure: Soft nontender nondistended bowel sounds positive normoactive  Anorectal examination was done because report a poor prep.  This was clear without solid material in the vault.    Planned proceedures: Colonoscopy and indicated procedures.    Vincent Sails, MD Gastroenterology 12/12/2017  8:35 AM

## 2017-12-12 NOTE — Transfer of Care (Signed)
Immediate Anesthesia Transfer of Care Note  Patient: Vincent Stephenson  Procedure(s) Performed: COLONOSCOPY WITH PROPOFOL (N/A )  Patient Location: PACU  Anesthesia Type:General  Level of Consciousness: awake, alert  and oriented  Airway & Oxygen Therapy: Patient Spontanous Breathing and Patient connected to nasal cannula oxygen  Post-op Assessment: Report given to RN and Post -op Vital signs reviewed and stable  Post vital signs: Reviewed and stable  Last Vitals:  Vitals Value Taken Time  BP 87/49 12/12/2017  9:08 AM  Temp    Pulse 79 12/12/2017  9:12 AM  Resp 17 12/12/2017  9:12 AM  SpO2 99 % 12/12/2017  9:12 AM  Vitals shown include unvalidated device data.  Last Pain:  Vitals:   12/12/17 0908  TempSrc: Tympanic  PainSc: 0-No pain         Complications: No apparent anesthesia complications

## 2017-12-12 NOTE — Op Note (Signed)
[name] Patient Name: Vincent Stephenson Procedure Date: 12/12/2017 8:30 AM MRN: [id number] Account #: [id number] Date of Birth: 1956/02/28 Admit Type: Outpatient Age: 62 Room: [facility] ENDO ROOM 2 Gender: Male Note Status: Finalized Procedure:            Colonoscopy Indications:          Screening for colorectal malignant neoplasm Providers:            Lollie Sails, MD Referring MD:         Lorelee Market (Referring MD) Medicines:            Monitored Anesthesia Care Complications:        No immediate complications. Procedure:            Pre-Anesthesia Assessment:                       - ASA Grade Assessment: III - A patient with severe                        systemic disease.                       After obtaining informed consent, the colonoscope was                        passed under direct vision. Throughout the procedure,                        the patient's blood pressure, pulse, and oxygen                        saturations were monitored continuously. The                        Colonoscope was introduced through the anus and                        advanced to the the cecum, identified by appendiceal                        orifice and ileocecal valve. The colonoscopy was                        performed without difficulty. The patient tolerated the                        procedure well. The quality of the bowel preparation                        was good. Findings:      A 4 mm polyp was found in the proximal ascending colon. The polyp was       sessile. The polyp was removed with a cold snare. Resection and       retrieval were complete.      The exam was otherwise normal throughout the examined colon.      The retroflexed view of the distal rectum and anal verge was normal and       showed no anal or rectal abnormalities.      The digital rectal exam was normal. Impression:           -  One 4 mm polyp in the proximal ascending  colon,                        removed with a cold snare. Resected and retrieved.                       - The distal rectum and anal verge are normal on                        retroflexion view. Recommendation:       - Discharge patient to home.                       - Advance diet as tolerated. Procedure Code(s):    --- Professional ---                       [id number], Colonoscopy, flexible; with removal of tumor(s),                        polyp(s), or other lesion(s) by snare technique Diagnosis Code(s):    --- Professional ---                       Z12.11, Encounter for screening for malignant neoplasm                        of colon                       D12.2, Benign neoplasm of ascending colon CPT copyright 2017 American Medical Association. All rights reserved. The codes documented in this report are preliminary and upon coder review may  be revised to meet current compliance requirements. Lollie Sails, MD 12/12/2017 9:07:08 AM This report has been signed electronically. Number of Addenda: 0 Note Initiated On: 12/12/2017 8:30 AM Scope Withdrawal Time: 0 hours 6 minutes 27 seconds  Total Procedure Duration: 0 hours 18 minutes 14 seconds       [facility]
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
## 2017-12-13 LAB — SURGICAL PATHOLOGY

## 2017-12-16 ENCOUNTER — Encounter: Payer: Self-pay | Admitting: Gastroenterology

## 2019-01-02 ENCOUNTER — Ambulatory Visit (LOCAL_COMMUNITY_HEALTH_CENTER): Payer: Medicare Other | Admitting: Nurse Practitioner

## 2019-01-02 DIAGNOSIS — Z111 Encounter for screening for respiratory tuberculosis: Secondary | ICD-10-CM | POA: Diagnosis not present

## 2019-04-09 ENCOUNTER — Other Ambulatory Visit: Payer: Self-pay

## 2019-04-09 ENCOUNTER — Encounter: Payer: Self-pay | Admitting: *Deleted

## 2019-04-09 ENCOUNTER — Emergency Department: Payer: Medicare Other

## 2019-04-09 ENCOUNTER — Emergency Department
Admission: EM | Admit: 2019-04-09 | Discharge: 2019-04-09 | Disposition: A | Discharge status: Home / Self Care | Payer: Medicare Other | Attending: Emergency Medicine | Admitting: Emergency Medicine

## 2019-04-09 DIAGNOSIS — J449 Chronic obstructive pulmonary disease, unspecified: Secondary | ICD-10-CM | POA: Diagnosis not present

## 2019-04-09 DIAGNOSIS — Z79899 Other long term (current) drug therapy: Secondary | ICD-10-CM | POA: Diagnosis not present

## 2019-04-09 DIAGNOSIS — Z7901 Long term (current) use of anticoagulants: Secondary | ICD-10-CM | POA: Insufficient documentation

## 2019-04-09 DIAGNOSIS — R531 Weakness: Secondary | ICD-10-CM | POA: Diagnosis present

## 2019-04-09 DIAGNOSIS — I129 Hypertensive chronic kidney disease with stage 1 through stage 4 chronic kidney disease, or unspecified chronic kidney disease: Secondary | ICD-10-CM | POA: Insufficient documentation

## 2019-04-09 DIAGNOSIS — N189 Chronic kidney disease, unspecified: Secondary | ICD-10-CM | POA: Insufficient documentation

## 2019-04-09 LAB — CBC
HCT: 43.3 % (ref 39.0–52.0)
Hemoglobin: 14.5 g/dL (ref 13.0–17.0)
MCH: 32.5 pg (ref 26.0–34.0)
MCHC: 33.5 g/dL (ref 30.0–36.0)
MCV: 97.1 fL (ref 80.0–100.0)
Platelets: 143 10*3/uL — ABNORMAL LOW (ref 150–400)
RBC: 4.46 MIL/uL (ref 4.22–5.81)
RDW: 13.5 % (ref 11.5–15.5)
WBC: 6.8 10*3/uL (ref 4.0–10.5)
nRBC: 0 % (ref 0.0–0.2)

## 2019-04-09 LAB — URINALYSIS, COMPLETE (UACMP) WITH MICROSCOPIC
Bacteria, UA: NONE SEEN
Bilirubin Urine: NEGATIVE
Glucose, UA: NEGATIVE mg/dL
Hgb urine dipstick: NEGATIVE
Ketones, ur: 5 mg/dL — AB
Leukocytes,Ua: NEGATIVE
Nitrite: NEGATIVE
Protein, ur: NEGATIVE mg/dL
Specific Gravity, Urine: 1.023 (ref 1.005–1.030)
pH: 5 (ref 5.0–8.0)

## 2019-04-09 LAB — BASIC METABOLIC PANEL
Anion gap: 10 (ref 5–15)
BUN: 27 mg/dL — ABNORMAL HIGH (ref 8–23)
CO2: 28 mmol/L (ref 22–32)
Calcium: 9.4 mg/dL (ref 8.9–10.3)
Chloride: 100 mmol/L (ref 98–111)
Creatinine, Ser: 0.99 mg/dL (ref 0.61–1.24)
GFR calc Af Amer: >60 mL/min (ref >60)
GFR calc non Af Amer: >60 mL/min (ref >60)
Glucose, Bld: 108 mg/dL — ABNORMAL HIGH (ref 70–99)
Potassium: 4.2 mmol/L (ref 3.5–5.1)
Sodium: 138 mmol/L (ref 135–145)

## 2019-04-09 NOTE — ED Notes (Signed)
This RN attempted to contact pt's legal guardian Blenda Mounts [phone], unable to contact pt's legal guardian, attempted to call after hours number [phone], instructed by SW answer after hours number that I would need to call primary SW # due to it not being after hours. This RN explained to caregiver that due to patient being possibly altered and lethargic, highly recommend she at least wait until patient is taken back to a room prior to leaving as she is responsible for patient. Pt's caregiver initially refused to go into triage room with patient and answer questions despite this RN being told by PCP that patient altered and poor historian.

## 2019-04-09 NOTE — Discharge Instructions (Addendum)
At this time, Vincent Stephenson is stable for discharge back to his facility.  He has normal blood work including a normal sodium level.  He has no signs of significant dehydration.  He has no evidence of infection.  He is walking without difficulty.  He should return to the emergency department immediately for new or worsening difficulty walking, weakness, fevers, vomiting or inability to eat, or any other new or worsening symptoms that are concerning to facility staff.  He should follow-up with his primary care doctor within the next 1 to 2 weeks and may need further work-up as an outpatient.

## 2019-04-09 NOTE — ED Triage Notes (Signed)
Pt brought in by care giver.  Pt lives an assisted living home.  Pt sent from urgent care for eval of dehydration/weakness.  Pt alert.  Denies n/v/d.  Pt reports decreased po intake.

## 2019-04-09 NOTE — ED Notes (Addendum)
This RN attempted to contact pt's legal guardian, Blenda Mounts [facility] [phone].

## 2019-04-09 NOTE — ED Notes (Signed)
Adriene Carmicheal, SW from [facility] returned call for guardian.

## 2019-04-09 NOTE — ED Notes (Signed)
Pt's caregiver from group home Leeanne Deed initially agreed to stay with patient until he got a room, however has now left. States to call and she will come pick patient up.

## 2019-04-09 NOTE — ED Notes (Signed)
Called SW @ [employer], after hrs line, requesting a call back from social work regarding pt's visit to ED.

## 2019-04-09 NOTE — ED Notes (Signed)
Calling after hours number Left message w/ after hours operator.

## 2019-04-09 NOTE — ED Triage Notes (Addendum)
First RN Note: Pt presents to ED via POV with caregiver from group home. Pt is a ward of [facility]. Pt presents to ED from PCP officer due to lethargy, ataxia and being slow to respond. Per Gerald Stabs, NP from office pt has hx of dehydration and decreased sodium. Pt is from [place] and [facility]. Pt arrives alert and looking around at this time.

## 2019-04-09 NOTE — ED Notes (Signed)
[name], group home care provider, contacted to come pick pt up.
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
## 2019-06-12 ENCOUNTER — Other Ambulatory Visit: Payer: Self-pay | Admitting: Family

## 2019-06-12 ENCOUNTER — Encounter: Payer: Self-pay | Admitting: Family

## 2019-06-12 ENCOUNTER — Other Ambulatory Visit: Payer: Self-pay

## 2019-06-12 ENCOUNTER — Ambulatory Visit (INDEPENDENT_AMBULATORY_CARE_PROVIDER_SITE_OTHER): Payer: Medicare Other | Admitting: Family

## 2019-06-12 VITALS — BP 130/84 | Ht 70.0 in

## 2019-06-12 DIAGNOSIS — Z125 Encounter for screening for malignant neoplasm of prostate: Secondary | ICD-10-CM

## 2019-06-12 DIAGNOSIS — Z0000 Encounter for general adult medical examination without abnormal findings: Secondary | ICD-10-CM | POA: Insufficient documentation

## 2019-06-12 DIAGNOSIS — I4891 Unspecified atrial fibrillation: Secondary | ICD-10-CM | POA: Diagnosis not present

## 2019-06-12 DIAGNOSIS — F339 Major depressive disorder, recurrent, unspecified: Secondary | ICD-10-CM | POA: Insufficient documentation

## 2019-06-12 DIAGNOSIS — E785 Hyperlipidemia, unspecified: Secondary | ICD-10-CM

## 2019-06-12 DIAGNOSIS — J302 Other seasonal allergic rhinitis: Secondary | ICD-10-CM

## 2019-06-12 DIAGNOSIS — R413 Other amnesia: Secondary | ICD-10-CM | POA: Diagnosis not present

## 2019-06-12 DIAGNOSIS — Z114 Encounter for screening for human immunodeficiency virus [HIV]: Secondary | ICD-10-CM

## 2019-06-12 MED ORDER — DIVALPROEX SODIUM 500 MG PO DR TAB: 500.0000 mg | DELAYED_RELEASE_TABLET | Freq: Every day | ORAL | 0 refills | Status: DC

## 2019-06-12 MED ORDER — RISPERIDONE 0.5 MG PO TABS: 0.5000 mg | ORAL_TABLET | Freq: Every day | ORAL | 1 refills | Status: DC

## 2019-06-12 MED ORDER — DABIGATRAN ETEXILATE MESYLATE 150 MG PO CAPS: 150.0000 mg | ORAL_CAPSULE | Freq: Two times a day (BID) | ORAL | 1 refills | Status: DC

## 2019-06-12 MED ORDER — DIVALPROEX SODIUM 500 MG PO DR TAB: 500.0000 mg | DELAYED_RELEASE_TABLET | Freq: Three times a day (TID) | ORAL | 0 refills | Status: DC

## 2019-06-12 MED ORDER — METOPROLOL TARTRATE 25 MG PO TABS: 25.0000 mg | ORAL_TABLET | Freq: Every day | ORAL | 1 refills | Status: DC

## 2019-06-12 MED ORDER — MONTELUKAST SODIUM 10 MG PO TABS: 10.0000 mg | ORAL_TABLET | Freq: Every day | ORAL | 1 refills | Status: DC

## 2019-06-12 MED ORDER — ATORVASTATIN CALCIUM 10 MG PO TABS: 10.0000 mg | ORAL_TABLET | Freq: Every day | ORAL | 1 refills | Status: DC

## 2019-06-12 MED ORDER — DONEPEZIL HCL 10 MG PO TABS: 10.0000 mg | ORAL_TABLET | Freq: Every day | ORAL | 0 refills | Status: DC

## 2019-06-12 MED ORDER — BENZONATATE 100 MG PO CAPS: 100.0000 mg | ORAL_CAPSULE | Freq: Three times a day (TID) | ORAL | 1 refills | Status: DC | PRN

## 2019-06-12 MED ORDER — VENLAFAXINE HCL ER 150 MG PO CP24: 150.0000 mg | ORAL_CAPSULE | Freq: Every day | ORAL | 0 refills | Status: DC

## 2019-06-12 NOTE — Assessment & Plan Note (Signed)
First time meeting patient today, limited by virtual environment.  He is pleasant and does not appear irritated.  He does not state he has any concerns, nor does caregiver, however there is an obvious baseline confusion here as patient cannot tell me the date or where he is.  Pending medical records and close follow-up.  For now he will stay on Aricept and established with psychiatry.  At follow-up we will discuss whether consult with neurology, imaging etc. is warranted

## 2019-06-12 NOTE — Assessment & Plan Note (Signed)
This has been a bit complicated as patient is poor historian and caregiver does not know patient's health history.  He lives in a group home.  I have reconciled his medications with caregiver today, will continue current regimen for now.  He has upcoming psychiatry appointment to establish care.  I have given 1 month supply of Depakote, Risperdal, Effexor, Aricept and advised caregiver that I am most comfortable if these medications were resumed by psychiatry as I do not typically manage bipolar which is what loose history in Epic is indicating at this time.
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
## 2019-06-12 NOTE — Assessment & Plan Note (Addendum)
We are pending medical records at this time and requested these from caregiver from prior PCP.  Doesn't appear that patient has seen a cardiologist.  It does look like he has a history of A. fib, last EKG in 2012 does show atrial fibrillation.  He is on propranolol and pradaxa. NO recent falls.  His CHA2DS2-VASc score is 2, however this is based on a very limited history of the patient.  May not need to be on Pradaxa and likely at follow-up we will discuss consult with cardiology to help in making this decision.   For now, I have refilled advised patient to stay on this regimen.

## 2019-06-12 NOTE — Assessment & Plan Note (Signed)
Continue statin for now.  We will get labs at follow-up

## 2019-06-16 NOTE — Progress Notes (Signed)
Unsure what you mean by needs caregiver?

## 2019-07-02 ENCOUNTER — Other Ambulatory Visit: Payer: Self-pay | Admitting: Family

## 2019-07-02 DIAGNOSIS — F339 Major depressive disorder, recurrent, unspecified: Secondary | ICD-10-CM

## 2019-07-02 NOTE — Progress Notes (Signed)
Done

## 2019-07-07 ENCOUNTER — Other Ambulatory Visit: Payer: Self-pay

## 2019-07-07 ENCOUNTER — Other Ambulatory Visit (INDEPENDENT_AMBULATORY_CARE_PROVIDER_SITE_OTHER): Payer: Medicare Other

## 2019-07-07 DIAGNOSIS — E785 Hyperlipidemia, unspecified: Secondary | ICD-10-CM

## 2019-07-07 DIAGNOSIS — Z114 Encounter for screening for human immunodeficiency virus [HIV]: Secondary | ICD-10-CM

## 2019-07-08 LAB — CBC WITH DIFFERENTIAL/PLATELET
Basophils Absolute: 0 10*3/uL (ref 0.0–0.1)
Basophils Relative: 0.6 % (ref 0.0–3.0)
Eosinophils Absolute: 0.1 10*3/uL (ref 0.0–0.7)
Eosinophils Relative: 1.8 % (ref 0.0–5.0)
HCT: 39.6 % (ref 39.0–52.0)
Hemoglobin: 13.3 g/dL (ref 13.0–17.0)
Lymphocytes Relative: 29.5 % (ref 12.0–46.0)
Lymphs Abs: 2 10*3/uL (ref 0.7–4.0)
MCHC: 33.7 g/dL (ref 30.0–36.0)
MCV: 100.4 fl — ABNORMAL HIGH (ref 78.0–100.0)
Monocytes Absolute: 0.8 10*3/uL (ref 0.1–1.0)
Monocytes Relative: 11.4 % (ref 3.0–12.0)
Neutro Abs: 3.9 10*3/uL (ref 1.4–7.7)
Neutrophils Relative %: 56.7 % (ref 43.0–77.0)
Platelets: 131 10*3/uL — ABNORMAL LOW (ref 150.0–400.0)
RBC: 3.94 Mil/uL — ABNORMAL LOW (ref 4.22–5.81)
RDW: 15.7 % — ABNORMAL HIGH (ref 11.5–15.5)
WBC: 6.8 10*3/uL (ref 4.0–10.5)

## 2019-07-08 LAB — COMPREHENSIVE METABOLIC PANEL
ALT: 13 U/L (ref 0–53)
AST: 15 U/L (ref 0–37)
Albumin: 4 g/dL (ref 3.5–5.2)
Alkaline Phosphatase: 29 U/L — ABNORMAL LOW (ref 39–117)
BUN: 24 mg/dL — ABNORMAL HIGH (ref 6–23)
CO2: 30 mEq/L (ref 19–32)
Calcium: 9.2 mg/dL (ref 8.4–10.5)
Chloride: 99 mEq/L (ref 96–112)
Creatinine, Ser: 0.98 mg/dL (ref 0.40–1.50)
GFR: 77.11 mL/min (ref >60.00)
Glucose, Bld: 93 mg/dL (ref 70–99)
Potassium: 4.6 mEq/L (ref 3.5–5.1)
Sodium: 136 mEq/L (ref 135–145)
Total Bilirubin: 0.4 mg/dL (ref 0.2–1.2)
Total Protein: 6.9 g/dL (ref 6.0–8.3)

## 2019-07-08 LAB — HEPATITIS C ANTIBODY
Hepatitis C Ab: NONREACTIVE
SIGNAL TO CUT-OFF: 0.01 (ref <1.00)

## 2019-07-08 LAB — LIPID PANEL
Cholesterol: 113 mg/dL (ref 0–200)
HDL: 47.4 mg/dL (ref >39.00)
LDL Cholesterol: 55 mg/dL (ref 0–99)
NonHDL: 65.54
Total CHOL/HDL Ratio: 2
Triglycerides: 55 mg/dL (ref 0.0–149.0)
VLDL: 11 mg/dL (ref 0.0–40.0)

## 2019-07-08 LAB — TSH: TSH: 1.33 u[IU]/mL (ref 0.35–4.50)

## 2019-07-08 LAB — HIV ANTIBODY (ROUTINE TESTING W REFLEX): HIV 1&2 Ab, 4th Generation: NONREACTIVE

## 2019-07-09 ENCOUNTER — Other Ambulatory Visit: Payer: Self-pay | Admitting: Family

## 2019-07-09 DIAGNOSIS — I4891 Unspecified atrial fibrillation: Secondary | ICD-10-CM
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
## 2019-07-10 ENCOUNTER — Telehealth: Payer: Self-pay | Admitting: Family

## 2019-07-10 NOTE — Telephone Encounter (Signed)
I spoke with patient's caregiver & she will call back Dr. Donivan Scull office to scheduled appointment. I explained the necessity & importance of this appointment due to low platelets plus his chance of b;bleeding out.

## 2019-07-10 NOTE — Telephone Encounter (Signed)
Call caregiver Vincent Stephenson  I got a note that she declined cardiology/ Dr Rockey Situ referral to [facility] as she would like virtual appt and they require new patients to come in person.   Is there anyway we re-consider this?  Dr Rockey Situ is THE BEST.  This is very important to patient's health. We do not have a good history of his heart health, and it appears he has afib which put him at risk for stroke.   He is a on blood thinning medication which is now causing his platelets to get low now putting him at risk for SEVERE, life threatening bleeding.   It really is important for him to see cardiology in person so they can decide if needs to stay on blood thinning medication.

## 2019-07-13 NOTE — Telephone Encounter (Signed)
noted 

## 2019-07-14 ENCOUNTER — Other Ambulatory Visit: Payer: Self-pay

## 2019-07-14 ENCOUNTER — Encounter: Payer: Self-pay | Admitting: Family

## 2019-07-14 ENCOUNTER — Ambulatory Visit (INDEPENDENT_AMBULATORY_CARE_PROVIDER_SITE_OTHER): Payer: Medicare Other | Admitting: Family

## 2019-07-14 VITALS — BP 101/64 | HR 74 | Ht 70.0 in | Wt 147.6 lb

## 2019-07-14 DIAGNOSIS — R413 Other amnesia: Secondary | ICD-10-CM

## 2019-07-14 DIAGNOSIS — I4891 Unspecified atrial fibrillation: Secondary | ICD-10-CM

## 2019-07-14 DIAGNOSIS — R634 Abnormal weight loss: Secondary | ICD-10-CM | POA: Diagnosis not present

## 2019-07-14 NOTE — Assessment & Plan Note (Addendum)
Stable, unchanged. Concerned based on his age, description of shuffling gait and tremor per caregiver. unable to assess this over video. Concern for Parkinson's and /or alcohol-related dementia . no family history known. Marland Kitchen  Pending CT head versus MRI brain as I do not know history in regards to pacemaker although I think unlikely.  Pending additional labs and referral to neurology. Close follow up.

## 2019-07-14 NOTE — Assessment & Plan Note (Signed)
Appears asymptomatic.  Blood pressure improved after taken a second time. Advised to spot check to ensure he is not hypotensive, or symptomatic. Patient establishing with cardiology in 2 days.  Discussed my concern with a low platelet, Pradaxa with patient and caregiver.  Appears to be tolerating Pradaxa.  I have sent Dr Charlestine Night  a message in regard to this.  Will await his advice here.

## 2019-07-14 NOTE — Assessment & Plan Note (Addendum)
Appears stable at this time. Lost 3 pounds since 11/2017. Normal albumin. Advised boost shakes and for weekly weight checks. Will continue to monitor.

## 2019-07-16 ENCOUNTER — Other Ambulatory Visit: Payer: Self-pay

## 2019-07-16 ENCOUNTER — Telehealth: Payer: Self-pay | Admitting: Cardiology

## 2019-07-16 ENCOUNTER — Ambulatory Visit (INDEPENDENT_AMBULATORY_CARE_PROVIDER_SITE_OTHER): Payer: Medicare Other | Admitting: Cardiology

## 2019-07-16 ENCOUNTER — Encounter: Payer: Self-pay | Admitting: Neurology

## 2019-07-16 ENCOUNTER — Encounter: Payer: Self-pay | Admitting: Cardiology

## 2019-07-16 VITALS — BP 90/50 | HR 63 | Ht 69.0 in | Wt 147.0 lb

## 2019-07-16 DIAGNOSIS — I4891 Unspecified atrial fibrillation: Secondary | ICD-10-CM | POA: Diagnosis not present

## 2019-07-16 DIAGNOSIS — D696 Thrombocytopenia, unspecified: Secondary | ICD-10-CM

## 2019-07-16 MED ORDER — APIXABAN 5 MG PO TABS: 5.0000 mg | ORAL_TABLET | Freq: Two times a day (BID) | ORAL | 11 refills | Status: DC

## 2019-07-16 MED ORDER — METOPROLOL SUCCINATE ER 25 MG PO TB24: 25.0000 mg | ORAL_TABLET | Freq: Every day | ORAL | 3 refills | Status: DC

## 2019-07-16 NOTE — Telephone Encounter (Signed)
Returned the call to the patient's pharmacy. Spoke with the [employer]. Tyrone Apple that the patient was seen by Dr. Garen Lah. His Pradaxa was stopped and Elquis 5 mg bid was started. Iona Beard verbalized understanding and voiced appreciation for the call. No further action needed.

## 2019-07-16 NOTE — Telephone Encounter (Signed)
[facility] calling in with drug interaction question  Patient was recently prescribed Eliquis however the pharmacist noticed a severe interaction with Pradaxa that patient has been taking for years.  Please advise by calling [phone]
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
## 2019-07-16 NOTE — Patient Instructions (Signed)
Medication Instructions:  Your physician has recommended you make the following change in your medication:  1. STOP Metoprolol tartrate 2. START Metoprolol succinate 25 mg once daily 3. STOP Pradaxa 4. START Eliquis 5 mg twice daily   *If you need a refill on your cardiac medications before your next appointment, please call your pharmacy*  Lab Work: CBC to be done the day of your echocardiogram.  If you have labs (blood work) drawn today and your tests are completely normal, you will receive your results only by: Marland Kitchen MyChart Message (if you have MyChart) OR . A paper copy in the mail If you have any lab test that is abnormal or we need to change your treatment, we will call you to review the results.  Testing/Procedures: Your physician has requested that you have an echocardiogram. Echocardiography is a painless test that uses sound waves to create images of your heart. It provides your doctor with information about the size and shape of your heart and how well your heart's chambers and valves are working. This procedure takes approximately one hour. There are no restrictions for this procedure.    Follow-Up: At [facility], you and your health needs are our priority.  As part of our continuing mission to provide you with exceptional heart care, we have created designated Provider Care Teams.  These Care Teams include your primary Cardiologist (physician) and Advanced Practice Providers (APPs -  Physician Assistants and Nurse Practitioners) who all work together to provide you with the care you need, when you need it.  Your next appointment:   Follow up after echocardiogram has been done.

## 2019-07-22 ENCOUNTER — Ambulatory Visit
Admission: RE | Admit: 2019-07-22 | Discharge: 2019-07-22 | Disposition: A | Discharge status: Home / Self Care | Payer: Medicare Other | Source: Ambulatory Visit | Attending: Family | Admitting: Family

## 2019-07-22 ENCOUNTER — Other Ambulatory Visit: Payer: Self-pay

## 2019-07-22 DIAGNOSIS — R413 Other amnesia: Secondary | ICD-10-CM | POA: Diagnosis present

## 2019-07-24 ENCOUNTER — Other Ambulatory Visit: Payer: Medicare Other

## 2019-07-28 ENCOUNTER — Other Ambulatory Visit: Payer: Medicare Other

## 2019-07-31 ENCOUNTER — Other Ambulatory Visit: Payer: Self-pay | Admitting: Family

## 2019-07-31 ENCOUNTER — Ambulatory Visit: Payer: Medicare Other | Admitting: Cardiology

## 2019-07-31 DIAGNOSIS — F339 Major depressive disorder, recurrent, unspecified: Secondary | ICD-10-CM

## 2019-08-10 ENCOUNTER — Other Ambulatory Visit (INDEPENDENT_AMBULATORY_CARE_PROVIDER_SITE_OTHER): Payer: Medicare Other

## 2019-08-10 ENCOUNTER — Other Ambulatory Visit: Payer: Self-pay

## 2019-08-10 DIAGNOSIS — R413 Other amnesia: Secondary | ICD-10-CM | POA: Diagnosis not present

## 2019-08-10 LAB — B12 AND FOLATE PANEL
Folate: 16.1 ng/mL (ref >5.9)
Vitamin B-12: 637 pg/mL (ref 211–911)

## 2019-08-11 LAB — RPR: RPR Ser Ql: NONREACTIVE

## 2019-08-31 ENCOUNTER — Other Ambulatory Visit: Payer: Self-pay | Admitting: Family

## 2019-08-31 DIAGNOSIS — F339 Major depressive disorder, recurrent, unspecified: Secondary | ICD-10-CM

## 2019-09-04 ENCOUNTER — Other Ambulatory Visit: Payer: Medicare Other

## 2019-09-28 ENCOUNTER — Other Ambulatory Visit: Payer: Self-pay | Admitting: Family

## 2019-09-28 DIAGNOSIS — F339 Major depressive disorder, recurrent, unspecified: Secondary | ICD-10-CM

## 2019-10-06 ENCOUNTER — Other Ambulatory Visit: Payer: Self-pay | Admitting: Neurology

## 2019-10-06 DIAGNOSIS — G20 Parkinson's disease: Secondary | ICD-10-CM

## 2019-10-14 ENCOUNTER — Encounter: Payer: Self-pay | Admitting: Family

## 2019-10-14 ENCOUNTER — Telehealth (INDEPENDENT_AMBULATORY_CARE_PROVIDER_SITE_OTHER): Payer: Medicare Other | Admitting: Family

## 2019-10-14 VITALS — BP 108/73 | HR 65 | Ht 70.0 in | Wt 155.0 lb

## 2019-10-14 DIAGNOSIS — F339 Major depressive disorder, recurrent, unspecified: Secondary | ICD-10-CM | POA: Diagnosis not present

## 2019-10-14 DIAGNOSIS — R413 Other amnesia: Secondary | ICD-10-CM

## 2019-10-14 DIAGNOSIS — I4891 Unspecified atrial fibrillation: Secondary | ICD-10-CM

## 2019-10-14 DIAGNOSIS — J449 Chronic obstructive pulmonary disease, unspecified: Secondary | ICD-10-CM | POA: Diagnosis not present
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
## 2019-10-14 NOTE — Assessment & Plan Note (Signed)
He is established with neurology, Dr. Manuella Ghazi is evaluating him for parkinsonian.  Pending MRI of brain.  Will follow

## 2019-10-14 NOTE — Assessment & Plan Note (Signed)
Patient compliant with Eliquis, Toprol.  Upcoming appointment cardiology, Dr Charlestine Night.  Pending CBC to see if platelets have improved which I have ordered today.

## 2019-10-14 NOTE — Progress Notes (Signed)
Patient scheduled for labs. Caregiver was okay with ordering DEXA. Let me know when you do & I can get him scheduled in July.

## 2019-10-15 ENCOUNTER — Other Ambulatory Visit: Payer: Self-pay

## 2019-10-15 ENCOUNTER — Ambulatory Visit (INDEPENDENT_AMBULATORY_CARE_PROVIDER_SITE_OTHER): Payer: Medicare Other

## 2019-10-15 DIAGNOSIS — I4891 Unspecified atrial fibrillation: Secondary | ICD-10-CM | POA: Diagnosis not present

## 2019-10-15 MED ORDER — PERFLUTREN LIPID MICROSPHERE
1.0000 mL | INTRAVENOUS | Status: AC | PRN
  Administered 2019-10-15: 2 mL via INTRAVENOUS

## 2019-10-16 ENCOUNTER — Telehealth: Payer: Self-pay | Admitting: Family

## 2019-10-16 NOTE — Telephone Encounter (Signed)
I called patient's caregiver & she did not receive my d/c order from two days ago. I have faxed again.

## 2019-10-16 NOTE — Telephone Encounter (Signed)
Vincent Stephenson needs DC orders faxed over to [place] for the below prescription:   Ventolin HFA

## 2019-10-20 ENCOUNTER — Other Ambulatory Visit: Payer: Self-pay | Admitting: Family

## 2019-10-20 DIAGNOSIS — M8008XA Age-related osteoporosis with current pathological fracture, vertebra(e), initial encounter for fracture: Secondary | ICD-10-CM

## 2019-10-20 DIAGNOSIS — Z8739 Personal history of other diseases of the musculoskeletal system and connective tissue: Secondary | ICD-10-CM

## 2019-10-20 NOTE — Progress Notes (Signed)
Patient was referred to neurology, but was scheduled in [place]. Pt's caregiver isn't familiar with [place] & would prefer to stay local. Can we refer him at [facility]?

## 2019-10-21 ENCOUNTER — Telehealth: Payer: Self-pay | Admitting: Family

## 2019-10-21 ENCOUNTER — Ambulatory Visit: Payer: Medicare Other | Admitting: Neurology

## 2019-10-21 NOTE — Telephone Encounter (Signed)
pt canceled appt and did not resch Biochemist, clinical - Neurology said about 5 hours ago  "lmomBiochemist, clinical - Neurology said 3 months ago  "pt canceled appt and did not resch Biochemist, clinical - Neurology said about 5 hours ago  "Rejection Reason - Patient DeclinedBiochemist, clinical - Neurology said about 5 hours ago

## 2019-10-22 ENCOUNTER — Ambulatory Visit
Admission: RE | Admit: 2019-10-22 | Discharge: 2019-10-22 | Disposition: A | Discharge status: Home / Self Care | Payer: Medicare Other | Source: Ambulatory Visit | Attending: Neurology | Admitting: Neurology

## 2019-10-22 ENCOUNTER — Other Ambulatory Visit: Payer: Self-pay

## 2019-10-22 DIAGNOSIS — G20 Parkinson's disease: Secondary | ICD-10-CM

## 2019-10-23 NOTE — Telephone Encounter (Signed)
Vincent Stephenson spoke with caregiver,  per Vincent Stephenson:  Patient was referred to neurology, but was scheduled in [place]. Pt's caregiver isn't familiar with [place] & would prefer to stay local. Can we refer him at [facility]?  Can we do that [place]?

## 2019-10-30 ENCOUNTER — Other Ambulatory Visit: Payer: Self-pay | Admitting: Family

## 2019-10-30 DIAGNOSIS — F339 Major depressive disorder, recurrent, unspecified: Secondary | ICD-10-CM

## 2019-11-04 NOTE — Telephone Encounter (Signed)
FYI pt had initial appt at [facility] on 05/142021 follow up is on 09/30 at 0900am

## 2019-11-05 ENCOUNTER — Other Ambulatory Visit: Payer: Medicare Other

## 2019-11-11 ENCOUNTER — Other Ambulatory Visit: Payer: Medicare Other

## 2019-11-18 ENCOUNTER — Other Ambulatory Visit (INDEPENDENT_AMBULATORY_CARE_PROVIDER_SITE_OTHER): Payer: Medicare Other

## 2019-11-18 ENCOUNTER — Other Ambulatory Visit: Payer: Self-pay

## 2019-11-18 DIAGNOSIS — I4891 Unspecified atrial fibrillation: Secondary | ICD-10-CM | POA: Diagnosis not present

## 2019-11-18 NOTE — Addendum Note (Signed)
Addended by: Elpidio Galea T on: 11/18/2019 10:05 AM   Modules accepted: Orders

## 2019-11-19 ENCOUNTER — Ambulatory Visit: Payer: Medicare Other | Admitting: Cardiology

## 2019-11-19 LAB — CBC WITH DIFFERENTIAL/PLATELET
Absolute Monocytes: 1331 cells/uL — ABNORMAL HIGH (ref 200–950)
Basophils Absolute: 44 cells/uL (ref 0–200)
Basophils Relative: 0.5 %
Eosinophils Absolute: 148 cells/uL (ref 15–500)
Eosinophils Relative: 1.7 %
HCT: 39.9 % (ref 38.5–50.0)
Hemoglobin: 13.5 g/dL (ref 13.2–17.1)
Lymphs Abs: 2036 cells/uL (ref 850–3900)
MCH: 34.5 pg — ABNORMAL HIGH (ref 27.0–33.0)
MCHC: 33.8 g/dL (ref 32.0–36.0)
MCV: 102 fL — ABNORMAL HIGH (ref 80.0–100.0)
MPV: 13 fL — ABNORMAL HIGH (ref 7.5–12.5)
Monocytes Relative: 15.3 %
Neutro Abs: 5142 cells/uL (ref 1500–7800)
Neutrophils Relative %: 59.1 %
Platelets: 150 10*3/uL (ref 140–400)
RBC: 3.91 10*6/uL — ABNORMAL LOW (ref 4.20–5.80)
RDW: 12.6 % (ref 11.0–15.0)
Total Lymphocyte: 23.4 %
WBC: 8.7 10*3/uL (ref 3.8–10.8)

## 2019-12-01 ENCOUNTER — Other Ambulatory Visit: Payer: Self-pay | Admitting: Family

## 2019-12-01 DIAGNOSIS — F339 Major depressive disorder, recurrent, unspecified: Secondary | ICD-10-CM

## 2019-12-01 DIAGNOSIS — J302 Other seasonal allergic rhinitis: Secondary | ICD-10-CM

## 2019-12-01 DIAGNOSIS — E785 Hyperlipidemia, unspecified: Secondary | ICD-10-CM

## 2019-12-14 ENCOUNTER — Telehealth: Payer: Self-pay | Admitting: Cardiology

## 2019-12-14 ENCOUNTER — Telehealth: Payer: Self-pay

## 2019-12-14 NOTE — Telephone Encounter (Signed)
°  Patient Consent for Virtual Visit         Vincent Stephenson has provided verbal consent on 12/14/2019 for a virtual visit (video or telephone).   CONSENT FOR VIRTUAL VISIT FOR:  Vincent Stephenson  By participating in this virtual visit I agree to the following:  I hereby voluntarily request, consent and authorize [facility] and its employed or contracted physicians, physician assistants, nurse practitioners or other licensed health care professionals (the Practitioner), to provide me with telemedicine health care services (the Services") as deemed necessary by the treating Practitioner. I acknowledge and consent to receive the Services by the Practitioner via telemedicine. I understand that the telemedicine visit will involve communicating with the Practitioner through live audiovisual communication technology and the disclosure of certain medical information by electronic transmission. I acknowledge that I have been given the opportunity to request an in-person assessment or other available alternative prior to the telemedicine visit and am voluntarily participating in the telemedicine visit.  I understand that I have the right to withhold or withdraw my consent to the use of telemedicine in the course of my care at any time, without affecting my right to future care or treatment, and that the Practitioner or I may terminate the telemedicine visit at any time. I understand that I have the right to inspect all information obtained and/or recorded in the course of the telemedicine visit and may receive copies of available information for a reasonable fee.  I understand that some of the potential risks of receiving the Services via telemedicine include:   Delay or interruption in medical evaluation due to technological equipment failure or disruption;  Information transmitted may not be sufficient (e.g. poor resolution of images) to allow for appropriate medical decision making by the Practitioner;  and/or   In rare instances, security protocols could fail, causing a breach of personal health information.  Furthermore, I acknowledge that it is my responsibility to provide information about my medical history, conditions and care that is complete and accurate to the best of my ability. I acknowledge that Practitioner's advice, recommendations, and/or decision may be based on factors not within their control, such as incomplete or inaccurate data provided by me or distortions of diagnostic images or specimens that may result from electronic transmissions. I understand that the practice of medicine is not an exact science and that Practitioner makes no warranties or guarantees regarding treatment outcomes. I acknowledge that a copy of this consent can be made available to me via my patient portal ([facility]), or I can request a printed copy by calling the office of [place].    I understand that my insurance will be billed for this visit.   I have read or had this consent read to me.  I understand the contents of this consent, which adequately explains the benefits and risks of the Services being provided via telemedicine.   I have been provided ample opportunity to ask questions regarding this consent and the Services and have had my questions answered to my satisfaction.  I give my informed consent for the services to be provided through the use of telemedicine in my medical care

## 2019-12-14 NOTE — Telephone Encounter (Signed)
Called Leeanne Deed to go over an insurance request for incontinence supplies. Scheduled appointment for 01/05/20 to discuss incontinence.

## 2019-12-15 ENCOUNTER — Other Ambulatory Visit: Payer: Self-pay

## 2019-12-15 ENCOUNTER — Encounter: Payer: Self-pay | Admitting: Cardiology

## 2019-12-15 ENCOUNTER — Telehealth (INDEPENDENT_AMBULATORY_CARE_PROVIDER_SITE_OTHER): Payer: Medicare Other | Admitting: Cardiology

## 2019-12-15 VITALS — BP 124/68 | HR 69 | Wt 158.0 lb

## 2019-12-15 DIAGNOSIS — I4891 Unspecified atrial fibrillation: Secondary | ICD-10-CM | POA: Diagnosis not present

## 2019-12-15 DIAGNOSIS — D696 Thrombocytopenia, unspecified: Secondary | ICD-10-CM
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
## 2019-12-15 NOTE — Patient Instructions (Signed)
Medication Instructions:  Your physician recommends that you continue on your current medications as directed. Please refer to the Current Medication list given to you today.  *If you need a refill on your cardiac medications before your next appointment, please call your pharmacy*   Lab Work: Your physician recommends that you return for lab work 1-3 days prior to your next 6 month appointment.  Please have your lab (cbc) drawn at the [facility] medical mall. You do not need an appointment. Lab hours are Mon-Fri 7am-6pm.   If you have labs (blood work) drawn today and your tests are completely normal, you will receive your results only by: Marland Kitchen MyChart Message (if you have MyChart) OR . A paper copy in the mail If you have any lab test that is abnormal or we need to change your treatment, we will call you to review the results.   Testing/Procedures: None ordered   Follow-Up: At [facility], you and your health needs are our priority.  As part of our continuing mission to provide you with exceptional heart care, we have created designated Provider Care Teams.  These Care Teams include your primary Cardiologist (physician) and Advanced Practice Providers (APPs -  Physician Assistants and Nurse Practitioners) who all work together to provide you with the care you need, when you need it.  We recommend signing up for the patient portal called "MyChart".  Sign up information is provided on this After Visit Summary.  MyChart is used to connect with patients for Virtual Visits (Telemedicine).  Patients are able to view lab/test results, encounter notes, upcoming appointments, etc.  Non-urgent messages can be sent to your provider as well.   To learn more about what you can do with MyChart, go to [email or web address].    Your next appointment:   6 month(s)  The format for your next appointment:   In Person  Provider:    You may see Kate Sable, MD or one of the following Advanced  Practice Providers on your designated Care Team:    Murray Hodgkins, NP  Christell Faith, PA-C  Marrianne Mood, PA-C    Other Instructions N/A

## 2019-12-30 ENCOUNTER — Other Ambulatory Visit: Payer: Self-pay | Admitting: Family

## 2019-12-30 DIAGNOSIS — F339 Major depressive disorder, recurrent, unspecified: Secondary | ICD-10-CM

## 2020-01-05 ENCOUNTER — Telehealth (INDEPENDENT_AMBULATORY_CARE_PROVIDER_SITE_OTHER): Payer: Medicare Other | Admitting: Family

## 2020-01-05 ENCOUNTER — Encounter: Payer: Self-pay | Admitting: Family

## 2020-01-05 ENCOUNTER — Telehealth: Payer: Self-pay | Admitting: Family

## 2020-01-05 VITALS — BP 124/68 | Ht 70.0 in | Wt 158.0 lb

## 2020-01-05 DIAGNOSIS — R32 Unspecified urinary incontinence: Secondary | ICD-10-CM | POA: Insufficient documentation

## 2020-01-05 NOTE — Telephone Encounter (Signed)
Call Gayland Curry nurse [phone]   Had televisit with patients caregiver today and PT and speech therapy never contacted patient He has had fall recently and struggling with getting to bathroom due to gait.   What is her advice for addressing urinary incontinence in setting of parkinson's?  Does she recommend PT? Does she start medications for overactive bladder?   Can we get these referrals started for patient?   Happy to speak with autumn if you get her on the phone.

## 2020-01-05 NOTE — Assessment & Plan Note (Signed)
Chronic, worsening. Unfortunately not able to speak with patient today.  Pending psa, urine studies to evaluate for other etiologies including enlarged prostate, infection. Long discussion with caregiver as likely is complicated by dementia and parkinson's. In particular memory impairment to recall to go to bathroom and his ability to get there quick enough due to gait. Call out to neurology in regards to PT which patient has not done. Reiterated the importance of this due to progressive nature of disease, fall prevention. Close follow up.

## 2020-01-05 NOTE — Progress Notes (Signed)
Verbal consent for services obtained from patient prior to services given to TELEPHONE visit:   Location of call:  provider at work patient at home  Names of all persons present for services: Mable Paris, NP and patient Chief complaint:   Caregiver on the phone who is primary historian. Patient is unavailable by phone as she is at different location this morning.  Primary concern is  urinary incontinence . Would like form completed so he can get pull up diapers from medicaid.  Occurs during the day and night.   Started a couple of months ago, and then has become more regular. Not able to get to the bathroom 'quick enough' due to his 'shuffling gait'.  Requires help with dressing himself. Stanton Kidney does put his clothes out during the day. Changes his 'pull up' with minimal assistance. He has assistance with bathing as caregiver worries he may fall.  May be sitting at table and urinate. Episodes 5-6 accidents per day. No fever, appetite changes, abdominal pain, hematuria, pain with bowel movement, diarrhea, or decreased urinary stream. Urine is not odorous nor dark.  Drinks 'fluids very well.' Weight has been stable.   Using walker Offer to toilet 4 times per day and he will refuse and then have an accident few minutes later.  Fell 6 weeks ago when getting up and walking. No head injury.  Hasnt done in PT.  Upcoming appointment with Gayland Curry  afib- recent appointment with dr Charlestine Night. Compliant with toprol, eliquis.  Improvement in platelets to 150 from 131  Parkinsonism    A/P/next steps:  Problem List Items Addressed This Visit      Other   Urinary incontinence - Primary    Chronic, worsening. Unfortunately not able to speak with patient today.  Pending psa, urine studies to evaluate for other etiologies including enlarged prostate, infection. Long discussion with caregiver as likely is complicated by dementia and parkinson's. In particular memory impairment to recall to go to  bathroom and his ability to get there quick enough due to gait. Call out to neurology in regards to PT which patient has not done. Reiterated the importance of this due to progressive nature of disease, fall prevention. Close follow up.       Relevant Orders   PSA   Comprehensive metabolic panel   Urinalysis, Routine w reflex microscopic   Urine Culture      I spent 21 min  discussing plan of care over the phone.

## 2020-01-05 NOTE — Telephone Encounter (Signed)
Clinic was called, but clinic is closed and will be open tomorrow morning.

## 2020-01-06 ENCOUNTER — Telehealth: Payer: Self-pay | Admitting: Family

## 2020-01-06 ENCOUNTER — Telehealth: Payer: Self-pay

## 2020-01-06 NOTE — Telephone Encounter (Signed)
No longer needed per azzel

## 2020-01-06 NOTE — Telephone Encounter (Signed)
Spoke to [employer] about the notes that are needed. Joycelyn Schmid stated that Carpenter needs to contact [employer]. I called [facility] and spoke to Shanon Brow to give the number to [facility] and he stated that he has already recieved the information and did not need the number.

## 2020-01-06 NOTE — Telephone Encounter (Signed)
Vincent Stephenson inquired about the Patients recent brain scan. States that she has spoken to the PCP during [place] recent appointment and explained which notes are needed for her personal records. OK to mail over patients recent CT scan on 07/22/19? Please advise if anything more should be sent over as discussed.

## 2020-01-06 NOTE — Telephone Encounter (Signed)
You can print and mail mri brain 10/22/19 However going forward advise her she needs to call [facility]

## 2020-01-06 NOTE — Telephone Encounter (Signed)
Called and spoke to Nurse Vincent Stephenson as Vincent Stephenson was with a patient. Left the providers questions with Vincent Stephenson and she states she will send a message to Vincent Stephenson when she is available.

## 2020-01-06 NOTE — Telephone Encounter (Signed)
Vincent Stephenson from [facility] in [place] called need chart note signed with dementia diagnosis fax to [phone]

## 2020-01-08 ENCOUNTER — Telehealth: Payer: Self-pay | Admitting: Family

## 2020-01-08 NOTE — Telephone Encounter (Signed)
Clinic was called, but clinic is closed.

## 2020-01-08 NOTE — Telephone Encounter (Signed)
Results have been printed and mailed. Vincent Stephenson Has not been notified to contact Medical Records for all incoming results as of yet by me.

## 2020-01-08 NOTE — Telephone Encounter (Signed)
Estill Bamberg with [facility]  Neurology called and would like a call back about pt. [phone]

## 2020-01-11 NOTE — Telephone Encounter (Signed)
Spoke with [facility] & she was informed. She did not take down medical records number. She stated that there was nothing further she needed.

## 2020-01-11 NOTE — Telephone Encounter (Signed)
LM for [name] to call back.

## 2020-01-12 NOTE — Telephone Encounter (Signed)
Attempted to call clinic. Was kept on hold and did not speak to a receptionist. Could not leave message to return call.

## 2020-01-12 NOTE — Progress Notes (Signed)
Done

## 2020-01-13 ENCOUNTER — Telehealth: Payer: Medicare Other | Admitting: Family

## 2020-01-15 NOTE — Telephone Encounter (Signed)
LM for caregiver Stanton Kidney to call back.

## 2020-01-15 NOTE — Telephone Encounter (Signed)
Call caregiver I had ordered labs, urine 01/05/20 Please sch asap due to urine complaints Advise her of conversation with neurology, amanda as well

## 2020-01-15 NOTE — Telephone Encounter (Signed)
Vincent Stephenson from [facility] called. She states that there are medications for overreactive bladder but Urinary incontinence and Overreactive bladder are different and they need to rule out a UTI for proper treatment. If this is a gradual Onset of symptoms, The PCP should evaluate and if that is negative than they will send a referral to Urology.   She has reached out to PT requesting a status but have not heard back from them as of yet. Vincent Stephenson states that she will call them back today to request more information.

## 2020-01-15 NOTE — Telephone Encounter (Signed)
I spoke with patient's caregiver & she is bringing him to leave urine at 8am Monday.

## 2020-01-18 ENCOUNTER — Other Ambulatory Visit: Payer: Self-pay

## 2020-01-18 ENCOUNTER — Other Ambulatory Visit: Payer: Medicare Other

## 2020-01-18 DIAGNOSIS — R32 Unspecified urinary incontinence: Secondary | ICD-10-CM

## 2020-01-18 NOTE — Addendum Note (Signed)
Addended by: Tor Netters I on: 01/18/2020 08:39 AM   Modules accepted: Orders

## 2020-01-19 ENCOUNTER — Emergency Department
Admission: EM | Admit: 2020-01-19 | Discharge: 2020-01-21 | Disposition: A | Discharge status: Skilled Nursing Facility | Payer: Medicare Other | Attending: Emergency Medicine | Admitting: Emergency Medicine

## 2020-01-19 ENCOUNTER — Emergency Department: Payer: Medicare Other

## 2020-01-19 ENCOUNTER — Other Ambulatory Visit: Payer: Self-pay

## 2020-01-19 DIAGNOSIS — Y939 Activity, unspecified: Secondary | ICD-10-CM | POA: Diagnosis not present

## 2020-01-19 DIAGNOSIS — J449 Chronic obstructive pulmonary disease, unspecified: Secondary | ICD-10-CM | POA: Insufficient documentation

## 2020-01-19 DIAGNOSIS — Y999 Unspecified external cause status: Secondary | ICD-10-CM | POA: Insufficient documentation

## 2020-01-19 DIAGNOSIS — Y929 Unspecified place or not applicable: Secondary | ICD-10-CM | POA: Diagnosis not present

## 2020-01-19 DIAGNOSIS — I129 Hypertensive chronic kidney disease with stage 1 through stage 4 chronic kidney disease, or unspecified chronic kidney disease: Secondary | ICD-10-CM | POA: Insufficient documentation

## 2020-01-19 DIAGNOSIS — Z79899 Other long term (current) drug therapy: Secondary | ICD-10-CM | POA: Insufficient documentation

## 2020-01-19 DIAGNOSIS — W010XXA Fall on same level from slipping, tripping and stumbling without subsequent striking against object, initial encounter: Secondary | ICD-10-CM | POA: Insufficient documentation

## 2020-01-19 DIAGNOSIS — Z20822 Contact with and (suspected) exposure to covid-19: Secondary | ICD-10-CM | POA: Insufficient documentation

## 2020-01-19 DIAGNOSIS — W19XXXA Unspecified fall, initial encounter: Secondary | ICD-10-CM

## 2020-01-19 DIAGNOSIS — R410 Disorientation, unspecified: Secondary | ICD-10-CM | POA: Insufficient documentation

## 2020-01-19 DIAGNOSIS — N189 Chronic kidney disease, unspecified: Secondary | ICD-10-CM | POA: Diagnosis not present

## 2020-01-19 DIAGNOSIS — S24104A Unspecified injury at T11-T12 level of thoracic spinal cord, initial encounter: Secondary | ICD-10-CM | POA: Diagnosis present

## 2020-01-19 DIAGNOSIS — S22080A Wedge compression fracture of T11-T12 vertebra, initial encounter for closed fracture: Secondary | ICD-10-CM | POA: Insufficient documentation

## 2020-01-19 DIAGNOSIS — S32010A Wedge compression fracture of first lumbar vertebra, initial encounter for closed fracture: Secondary | ICD-10-CM

## 2020-01-19 DIAGNOSIS — M545 Low back pain, unspecified: Secondary | ICD-10-CM

## 2020-01-19 LAB — RESPIRATORY PANEL BY RT PCR (FLU A&B, COVID)
Influenza A by PCR: NEGATIVE
Influenza B by PCR: NEGATIVE
SARS Coronavirus 2 by RT PCR: NEGATIVE

## 2020-01-19 NOTE — ED Triage Notes (Signed)
Pt in via EMS from a group home. Legal Guardian information sent via facility, [facility]. Pt had an accidental fall at 0615. Pt tripped. Pt walks with a walker and has parkinsons. Pain to mid back lumbar

## 2020-01-19 NOTE — Progress Notes (Signed)
Orthopedic Tech Progress Note Patient Details:  Vincent Stephenson 1955-06-15 [id number] Called in order to [facility] for a TLSO BRACE Patient ID: Vincent Stephenson, male   DOB: March 04, 1956, 64 y.o.   MRN: [id number]   Vincent Stephenson 01/19/2020, 4:44 PM

## 2020-01-19 NOTE — ED Notes (Signed)
Pt is fitted with Brace  He is able to roll from side to side  Spoke with Stanton Kidney from the group home  States he is not able to stand on he own   Spoke with Jewel,legal guardian  States she will try to find placement in am
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
## 2020-01-19 NOTE — ED Notes (Signed)
See triage note  Presents from family care home s/p fall   Tripped and fell  having pain to mid back

## 2020-01-19 NOTE — ED Triage Notes (Signed)
Pt comes into the ED via EMS from [facility], states he got tangled up and lost his balance and fell this morning, c/o mid to lower back pain. Denies any other pain or injury. Pt is a/ox4 at present.

## 2020-01-19 NOTE — ED Notes (Signed)
Took PT a sandwich tray and drink.

## 2020-01-19 NOTE — ED Notes (Signed)
Pt brief changed at this time 

## 2020-01-20 DIAGNOSIS — S22080A Wedge compression fracture of T11-T12 vertebra, initial encounter for closed fracture: Secondary | ICD-10-CM | POA: Diagnosis not present

## 2020-01-20 NOTE — ED Notes (Signed)
Dining service called due to pt not receiving meal tray. Dining service stating they would send a tray shortly.

## 2020-01-20 NOTE — ED Notes (Signed)
Pt transitioned to hospital bed at this time for comfort.

## 2020-01-20 NOTE — ED Notes (Signed)
Pt given meal tray.

## 2020-01-20 NOTE — TOC Initial Note (Addendum)
Transition of Care [facility]) - Initial/Assessment Note    Patient Details  Name: Vincent Stephenson MRN: [id number] Date of Birth: 15-Jan-1956  Transition of Care Vincent Stephenson) CM/SW Contact:    Vincent Stephenson Phone Number: 01/20/2020, 4:04 PM  Clinical Narrative:                 Patient coming from [facility] Stephenson with administrator-Vincent Stephenson [phone] states patient is long term resident of home however is currently unable to walk which is requirement for home. States patient can return once able to walk. Patient has Vincent Stephenson [phone] who is in agreeance with SNF placement and requesting placement at Vincent Stephenson if possible.   TOC Stephenson CM left message for Vincent Stephenson @ Vincent Stephenson requesting bed availability and need for 3 day waiver due to Medicare. PT has completed assessment and recommended SNF. PASSR pending review and Fl2 completed.   Expected Discharge Plan: Skilled Nursing Facility Barriers to Discharge: ED SNF auth   Patient Goals and CMS Choice Patient states their goals for this hospitalization and ongoing recovery are:: SNF placement CMS Medicare.gov Compare Post Acute Care list provided to:: Patient Represenative (must comment) Choice offered to / list presented to : [facility] / Guardian Vincent Stephenson)  Expected Discharge Plan and Services Expected Discharge Plan: [place] Choice: [place] Living arrangements for Vincent past 2 months: [facility])                                      Prior Living Arrangements/Services Living arrangements for Vincent past 2 months: [facility] ([facility]) Lives with:: Facility Resident Patient language and need for interpreter reviewed:: Yes Do you feel safe going back to Vincent place where you live?: Yes      Need for Family Participation in Patient Care: No (Comment) Care giver support system in place?: Yes (comment)    Criminal Activity/Legal Involvement Pertinent to Current Situation/Hospitalization: No - Comment as needed  Activities of Daily Living      Permission Sought/Granted Permission sought to share information with : Facility Sport and exercise psychologist, Guardian Permission granted to share information with : Yes, Verbal Permission Granted  Share Information with NAME: Vincent Stephenson  Permission granted to share info w AGENCY: [facility] [employer] granted to share info w Contact Information: [phone]  Emotional Assessment Appearance:: Appears stated age Attitude/Demeanor/Rapport: Unable to Assess   Orientation: : Oriented to Self, Oriented to Place, Oriented to  Time, Oriented to Situation Alcohol / Substance Use: Not Applicable Psych Involvement: No (comment)  Admission diagnosis:  fall ems Patient Active Problem List   Diagnosis Date Noted  . Urinary incontinence 01/05/2020  . Weight loss 07/14/2019  . Atrial fibrillation ([place]) 06/12/2019  . Encounter for medical examination to establish care 06/12/2019  . Depression, recurrent ([place]) 06/12/2019  . HLD (hyperlipidemia) 06/12/2019  . Memory loss 06/12/2019   PCP:  Burnard Hawthorne, FNP Pharmacy:   [facility], [address] [place] [address] Phone: [phone] Fax: [phone]     Social Determinants of Health (SDOH) Interventions    Readmission Risk Interventions No flowsheet data found.

## 2020-01-20 NOTE — ED Notes (Signed)
PT at bedside working with pt. Bed linen soiled. Pt up to toilet, linens changed and clean brief placed onto pt

## 2020-01-20 NOTE — NC FL2 (Signed)
[place] LEVEL OF CARE SCREENING TOOL     IDENTIFICATION  Patient Name: Vincent Stephenson Birthdate: [birth year] Sex: male Admission Date (Current Location): 01/19/2020  [facility] and [place] Number:  Engineering geologist and Address:  [facility], [address], [place], [place] [address]      Provider Number: [id number]  Attending Physician Name and Address:  No att. providers found  Relative Name and Phone Number:  Debbe Odea [phone]    Current Level of Care: Hospital Recommended Level of Care: [place] Prior Approval Number:    Date Approved/Denied:   PASRR Number: Pending  Discharge Plan: SNF    Current Diagnoses: Patient Active Problem List   Diagnosis Date Noted  . Urinary incontinence 01/05/2020  . Weight loss 07/14/2019  . Atrial fibrillation ([place]) 06/12/2019  . Encounter for medical examination to establish care 06/12/2019  . Depression, recurrent ([place]) 06/12/2019  . HLD (hyperlipidemia) 06/12/2019  . Memory loss 06/12/2019    Orientation RESPIRATION BLADDER Height & Weight     Self, Time, Situation, Place  Normal Continent Weight:   Height:     BEHAVIORAL SYMPTOMS/MOOD NEUROLOGICAL BOWEL NUTRITION STATUS      Continent Diet  AMBULATORY STATUS COMMUNICATION OF NEEDS Skin   Limited Assist Verbally Normal                       Personal Care Assistance Level of Assistance  Bathing, Dressing, Feeding Bathing Assistance: Limited assistance Feeding assistance: Limited assistance Dressing Assistance: Limited assistance     Functional Limitations Info             SPECIAL CARE FACTORS FREQUENCY  PT (By licensed PT), OT (By licensed OT)     PT Frequency: min 5xweek OT Frequency: min 5xweek            Contractures      Additional Factors Info                  Current Medications (01/20/2020):  This is the current hospital active medication list No current  facility-administered medications for this encounter.   Current Outpatient Medications  Medication Sig Dispense Refill  . apixaban (ELIQUIS) 5 MG TABS tablet Take 1 tablet (5 mg total) by mouth 2 (two) times daily. 60 tablet 11  . atorvastatin (LIPITOR) 10 MG tablet TAKE 1 TABLET BY MOUTH AT BEDTIME 90 tablet 1  . Cholecalciferol 1000 units capsule Take 1,000 Units by mouth daily.    . D3-1000 25 MCG (1000 UT) tablet TAKE 1 TABLET BY MOUTH EVERY DAY FOR SUPPLEMENT 30 tablet 3  . divalproex (DEPAKOTE) 500 MG DR tablet TAKE 1 TABLET BY MOUTH 3 TIMES A DAY 90 tablet 0  . donepezil (ARICEPT) 10 MG tablet TAKE 1 TABLET BY MOUTH AT BEDTIME 30 tablet 0  . metoprolol succinate (TOPROL XL) 25 MG 24 hr tablet Take 1 tablet (25 mg total) by mouth daily. 90 tablet 3  . montelukast (SINGULAIR) 10 MG tablet TAKE 1 TABLET BY MOUTH AT BEDTIME 90 tablet 1  . risperiDONE (RISPERDAL) 0.5 MG tablet TAKE 1 TABLET BY MOUTH AT BEDTIME 30 tablet 1  . venlafaxine XR (EFFEXOR-XR) 150 MG 24 hr capsule TAKE 1 CAPSULE BY MOUTH ONCE DAILY 30 capsule 0     Discharge Medications: Please see discharge summary for a list of discharge medications.  Relevant Imaging Results:  Relevant Lab Results:   Additional Information SS# [id number]  Anselm Pancoast,  RN

## 2020-01-20 NOTE — ED Notes (Signed)
Pt with soiled brief. This RN and Terri Piedra, RN cleaned pt. New brief placed on pt at this time.

## 2020-01-20 NOTE — ED Notes (Signed)
Pt given drink at this time 

## 2020-01-20 NOTE — TOC Progression Note (Addendum)
Transition of Care [facility]) - Progression Note    Patient Details  Name: Vincent Stephenson MRN: [id number] Date of Birth: [birth year]  Transition [facility]) CM/SW [place], RN Phone Number: 01/20/2020, 4:33 PM  Clinical Narrative:    [place] Perch completed and PASSR pending. Fl2 sent to other SNF as back up in the event [facility] is unable to accept for SNF.   Requested information sent to [facility] Must as requested for PASSR.   Expected Discharge Plan: Skilled Nursing Facility Barriers to Discharge: ED SNF auth  Expected Discharge Plan and Services Expected Discharge Plan: [place] Choice: [place] arrangements for the past 2 months: [facility] Home ([facility])                                       Social Determinants of Health (SDOH) Interventions    Readmission Risk Interventions No flowsheet data found.

## 2020-01-20 NOTE — ED Notes (Signed)
Pt assisted to the side of bed at this time

## 2020-01-20 NOTE — Evaluation (Signed)
Physical Therapy Evaluation Patient Details Name: Vincent Stephenson MRN: [id number] DOB: Aug 29, 1955 Today's Date: 01/20/2020   History of Present Illness  Per MD notes: Pt is a 64 y.o. male who presented to the emergency department for evaluation and treatment of mid back pain after a mechanical, nonsyncopal fall.  Patient with PMH that includes Parkinson's, bipolar disorder, cardiomegaly, cerebrovascular disease, COPD, CKD, depression, HTN, Lumbar spondylosis, and osteoporosis.  MD assessment includes L1 vertebral body compression fracture.    Clinical Impression  Pt was pleasant and motivated to participate during the session but ultimately was quite limited functionally. Pt required mod A with bed mobility and transfers and min A for stability and to guide the RW during ambulation.  Pt was only able to amb a maximum of 5' before stating he needed to sit down and ambulated with very slow, shuffling gait with max cues for increased step length and amb closer to the RW for safety.  At baseline pt reported being Mod Ind with amb with a RW in his group home with no other fall history other than current fall as well as being Ind with ADs.  Pt presented with significant deficits in functional mobility compared to his stated baseline and would not be safe to return to his prior living situation at this time.  Pt will benefit from PT services in a SNF setting upon discharge to safely address deficits listed in patient problem list for decreased caregiver assistance and eventual return to PLOF.       Follow Up Recommendations SNF;Supervision for mobility/OOB    Equipment Recommendations  None recommended by PT    Recommendations for Other Services       Precautions / Restrictions Precautions Precautions: Fall Required Braces or Orthoses: Spinal Brace Spinal Brace: Thoracolumbosacral orthotic Restrictions Weight Bearing Restrictions: No Other Position/Activity Restrictions: TLSO donned at all times  except with bathing      Mobility  Bed Mobility Overal bed mobility: Needs Assistance Bed Mobility: Rolling;Sidelying to Sit;Sit to Sidelying Rolling: Min assist Sidelying to sit: Mod assist     Sit to sidelying: Mod assist General bed mobility comments: Log roll technique training with max verbal and tactile cues for sequencing along with Mod A to complete tasks  Transfers Overall transfer level: Needs assistance Equipment used: Rolling walker (2 wheeled) Transfers: Sit to/from Stand Sit to Stand: Mod assist         General transfer comment: Mod A to stand from various height surfaces  Ambulation/Gait Ambulation/Gait assistance: Min assist Gait Distance (Feet): 5 Feet x 2 Assistive device: Rolling walker (2 wheeled) Gait Pattern/deviations: Step-through pattern;Decreased step length - right;Decreased step length - left;Trunk flexed;Shuffle;Festinating Gait velocity: decreased   General Gait Details: Max verbal cues for increased step length and to amb closer to the RW; very slow cadence with shuffling steps and min A for stability as well as to guide the RW during turns  Financial trader Rankin (Stroke Patients Only)       Balance Overall balance assessment: Needs assistance Sitting-balance support: Bilateral upper extremity supported;Feet unsupported Sitting balance-Leahy Scale: Poor Sitting balance - Comments: Min A to achieve and occasionally to maintain neutral sitting position   Standing balance support: Bilateral upper extremity supported;During functional activity Standing balance-Leahy Scale: Poor Standing balance comment: Occasional Min A for stability in standing  Pertinent Vitals/Pain Pain Assessment: 0-10 Pain Score: 5  Pain Location: low back Pain Descriptors / Indicators: Sore Pain Intervention(s): Monitored during session;Patient requesting pain meds-RN notified     Home Living Family/patient expects to be discharged to:: Group home                 Additional Comments: Ramp entrance    Prior Function Level of Independence: Needs assistance   Gait / Transfers Assistance Needed: Mod Ind amb in the group home with a RW, no other fall history other than current fall, uses a w/c for MD appointments  ADL's / [place] Needed: Facility staff assist with meals and meds but pt Ind with bathing and dressing        Hand Dominance        Extremity/Trunk Assessment   Upper Extremity Assessment Upper Extremity Assessment: Generalized weakness    Lower Extremity Assessment Lower Extremity Assessment: Generalized weakness       Communication   Communication: No difficulties  Cognition Arousal/Alertness: Awake/alert Behavior During Therapy: Flat affect Overall Cognitive Status: Within Functional Limits for tasks assessed                                        General Comments      Exercises Total Joint Exercises Ankle Circles/Pumps: AROM;Strengthening;Both;10 reps Quad Sets: Strengthening;Both;10 reps Gluteal Sets: Strengthening;Both;10 reps Heel Slides: AAROM;Both;10 reps Hip ABduction/ADduction: AAROM;Both;10 reps Straight Leg Raises: AAROM;Both;10 reps Long Arc Quad: AROM;Strengthening;Both;10 reps Marching in Standing: AROM;Both;5 reps;Standing;Strengthening Other Exercises Other Exercises: Log roll training and sit to/from stand transfer training from various height surfaces Other Exercises: HEP education for BLE APs, QS, and GS x 10 each 5x/day   Assessment/Plan    PT Assessment Patient needs continued PT services  PT Problem List Decreased strength;Decreased activity tolerance;Decreased balance;Decreased mobility;Decreased knowledge of use of DME;Pain       PT Treatment Interventions DME instruction;Gait training;Functional mobility training;Therapeutic activities;Therapeutic  exercise;Balance training;Patient/family education    PT Goals (Current goals can be found in the Care Plan section)  Acute Rehab PT Goals Patient Stated Goal: To get stronger and walk better PT Goal Formulation: With patient Time For Goal Achievement: 02/02/20 Potential to Achieve Goals: Fair    Frequency Min 2X/week   Barriers to discharge Inaccessible home environment;Decreased caregiver support      Co-evaluation               AM-PAC PT "6 Clicks" Mobility  Outcome Measure Help needed turning from your back to your side while in a flat bed without using bedrails?: A Lot Help needed moving from lying on your back to sitting on the side of a flat bed without using bedrails?: A Lot Help needed moving to and from a bed to a chair (including a wheelchair)?: A Lot Help needed standing up from a chair using your arms (e.g., wheelchair or bedside chair)?: A Lot Help needed to walk in hospital room?: A Lot Help needed climbing 3-5 steps with a railing? : Total 6 Click Score: 11    End of Session Equipment Utilized During Treatment: Gait belt;Other (comment) (TLSO brace) Activity Tolerance: Patient tolerated treatment well;No increased pain Patient left: in bed;Other (comment) (Pt left with B rails up in ER bed in hall by nsg station as found) Nurse Communication: Mobility status PT Visit Diagnosis: Unsteadiness on feet (R26.81);Muscle weakness (generalized) (M62.81);Difficulty in walking, not  elsewhere classified (R26.2);Pain Pain - Right/Left:  (central) Pain - part of body:  (low back)    Time: 4801-6553 PT Time Calculation (min) (ACUTE ONLY): 47 min   Charges:   PT Evaluation $PT Eval Moderate Complexity: 1 Mod PT Treatments $Therapeutic Exercise: 8-22 mins $Therapeutic Activity: 8-22 mins        D. Scott Cruzito Standre PT, DPT 01/20/20, 11:13 AM

## 2020-01-21 ENCOUNTER — Encounter: Payer: Self-pay | Admitting: Emergency Medicine

## 2020-01-21 NOTE — ED Notes (Signed)
Pt given meal tray. Pt sat up, cleaned, new brief placed.

## 2020-01-21 NOTE — ED Notes (Signed)
Dietary called and informed of pt needing meal tray

## 2020-01-21 NOTE — ED Notes (Signed)
Pt cleaned up and clean brief and gown placed on pt. Clean linens and chux placed on bed. Pt pulled up and given warm blankets at this time

## 2020-01-21 NOTE — TOC Progression Note (Addendum)
Transition of Care [facility]) - Progression Note    Patient Details  Name: DAEGEN BERROCAL MRN: [id number] Date of Birth: May 03, 1956  Transition [facility]) CM/SW [place], RN Phone Number: 01/21/2020, 11:56 AM  Clinical Narrative:    Damaris Schooner to [facility], confirmed  patient is vaccinated and bed availability for today. Will notify EDP and ED RN for transfer to [facility] room 603A   Expected Discharge Plan: [place] Barriers to Discharge: ED SNF auth  Expected Discharge Plan and Services Expected Discharge Plan: [place] Choice: [place] arrangements for the past 2 months: [facility] ([facility])                                       Social Determinants of Health (SDOH) Interventions    Readmission Risk Interventions No flowsheet data found.

## 2020-01-21 NOTE — ED Notes (Signed)
[facility] called for transport to [place]

## 2020-01-21 NOTE — ED Notes (Signed)
Attempted to call pt legal guardian Blenda Mounts, social worker to inform of pt d/c to [facility]. Mailbox is full and is unable to leave a message.

## 2020-01-21 NOTE — ED Notes (Signed)
Report given to Maudie Mercury, RN at [facility]

## 2020-01-21 NOTE — ED Notes (Signed)
Pt unable to E-sign for d/c due to confusion.

## 2020-01-21 NOTE — ED Notes (Signed)
[facility] called to resend tray.

## 2020-01-21 NOTE — TOC Progression Note (Signed)
Transition of Care [facility]) - Progression Note    Patient Details  Name: Vincent Stephenson MRN: [id number] Date of Birth: [birth year]  Transition of Care [facility]) CM/SW [place], RN Phone Number: 01/21/2020, 9:31 AM  Clinical Narrative:    Unable to place patient at [facility] as requested due to no SNF availability-that facility only offers ALF services. Bed offer received from [facility].   Outreach to [place] with bed offers. LVMM requesting callback.    Expected Discharge Plan: Skilled Nursing Facility Barriers to Discharge: ED SNF auth  Expected Discharge Plan and Services Expected Discharge Plan: [place] Choice: [place] arrangements for the past 2 months: [facility] Home ([facility])                                       Social Determinants of Health (SDOH) Interventions    Readmission Risk Interventions No flowsheet data found.

## 2020-03-03 ENCOUNTER — Telehealth: Payer: Self-pay

## 2020-03-03 NOTE — Telephone Encounter (Signed)
Patient has fallen & fractured his back. [facility] is now [facility] & needs more care than [facility] can provide. Pt's caregiver stated that new Fl2 needed to be filled out for patient to go to long term care facility. I have filled out most of this, just unsure of some questions. I have also placed in red folder for review & for you to sign.   Needs to be faxed to [facility] at [phone]

## 2020-03-03 NOTE — Telephone Encounter (Signed)
Leeanne Deed called and states that pt fell and broke his back and now needs long term skilled nursing assistance. Please call her back at [phone]

## 2020-03-04 NOTE — Telephone Encounter (Signed)
Call Vincent Stephenson Since injury, please review mobility status per flt2 form- is he ambulatory? Does he wander? Any help with personal care?   You will see these sections on form, please review with Vincent Stephenson

## 2020-03-04 NOTE — Telephone Encounter (Signed)
LM for Vincent Stephenson to call back.

## 2020-03-07 NOTE — Telephone Encounter (Signed)
[name] called back & asked if I could fax Fl2 to [facility]. I have started Fl2, but need more info. I have called [facility] to make sure of correct fax, but was unable to reach. Will try to call back later on.

## 2020-03-07 NOTE — Telephone Encounter (Signed)
I have spoken to [facility] admissions. Tammy helped my complete FL 2 & now just needs your signature. I have placed in red folder.

## 2020-03-08 NOTE — Telephone Encounter (Signed)
I have faxed to Tammy at [facility].

## 2020-03-08 NOTE — Telephone Encounter (Signed)
Completed Let me know if you need anything else

## 2020-03-28 ENCOUNTER — Other Ambulatory Visit: Payer: Self-pay | Admitting: Family

## 2020-03-28 DIAGNOSIS — F339 Major depressive disorder, recurrent, unspecified: Secondary | ICD-10-CM

## 2020-04-06 ENCOUNTER — Other Ambulatory Visit: Payer: Self-pay

## 2020-04-06 ENCOUNTER — Encounter: Payer: Self-pay | Admitting: Family

## 2020-04-06 ENCOUNTER — Ambulatory Visit (INDEPENDENT_AMBULATORY_CARE_PROVIDER_SITE_OTHER): Payer: Medicare Other | Admitting: Family

## 2020-04-06 VITALS — BP 102/70 | HR 75 | Temp 98.1°F | Ht 70.0 in

## 2020-04-06 DIAGNOSIS — G20 Parkinson's disease: Secondary | ICD-10-CM

## 2020-04-06 DIAGNOSIS — M4856XA Collapsed vertebra, not elsewhere classified, lumbar region, initial encounter for fracture: Secondary | ICD-10-CM

## 2020-04-06 DIAGNOSIS — Z125 Encounter for screening for malignant neoplasm of prostate: Secondary | ICD-10-CM

## 2020-04-06 DIAGNOSIS — G20A1 Parkinson's disease without dyskinesia, without mention of fluctuations: Secondary | ICD-10-CM

## 2020-04-06 DIAGNOSIS — F319 Bipolar disorder, unspecified: Secondary | ICD-10-CM

## 2020-04-06 DIAGNOSIS — E785 Hyperlipidemia, unspecified: Secondary | ICD-10-CM

## 2020-04-06 DIAGNOSIS — R8290 Unspecified abnormal findings in urine: Secondary | ICD-10-CM

## 2020-04-06 DIAGNOSIS — I4891 Unspecified atrial fibrillation: Secondary | ICD-10-CM | POA: Diagnosis not present

## 2020-04-06 DIAGNOSIS — M545 Low back pain, unspecified: Secondary | ICD-10-CM

## 2020-04-06 DIAGNOSIS — M8580 Other specified disorders of bone density and structure, unspecified site: Secondary | ICD-10-CM

## 2020-04-06 DIAGNOSIS — Z111 Encounter for screening for respiratory tuberculosis: Secondary | ICD-10-CM | POA: Diagnosis not present

## 2020-04-06 DIAGNOSIS — R351 Nocturia: Secondary | ICD-10-CM

## 2020-04-06 NOTE — Patient Instructions (Signed)
We will make appointment with psychiatry , neurology and neuosurgery  Let us know if you dont hear back within a week in regards to an appointment being scheduled.
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
## 2020-04-07 DIAGNOSIS — G20A1 Parkinson's disease without dyskinesia, without mention of fluctuations: Secondary | ICD-10-CM | POA: Insufficient documentation

## 2020-04-07 DIAGNOSIS — F319 Bipolar disorder, unspecified: Secondary | ICD-10-CM | POA: Insufficient documentation

## 2020-04-07 DIAGNOSIS — G20 Parkinson's disease: Secondary | ICD-10-CM | POA: Insufficient documentation

## 2020-04-07 DIAGNOSIS — M545 Low back pain, unspecified: Secondary | ICD-10-CM | POA: Insufficient documentation

## 2020-04-07 LAB — COMPREHENSIVE METABOLIC PANEL
ALT: 11 U/L (ref 0–53)
AST: 16 U/L (ref 0–37)
Albumin: 3.5 g/dL (ref 3.5–5.2)
Alkaline Phosphatase: 44 U/L (ref 39–117)
BUN: 26 mg/dL — ABNORMAL HIGH (ref 6–23)
CO2: 32 mEq/L (ref 19–32)
Calcium: 9 mg/dL (ref 8.4–10.5)
Chloride: 99 mEq/L (ref 96–112)
Creatinine, Ser: 1.19 mg/dL (ref 0.40–1.50)
GFR: 64.64 mL/min (ref >60.00)
Glucose, Bld: 79 mg/dL (ref 70–99)
Potassium: 4.5 mEq/L (ref 3.5–5.1)
Sodium: 137 mEq/L (ref 135–145)
Total Bilirubin: 0.4 mg/dL (ref 0.2–1.2)
Total Protein: 6.6 g/dL (ref 6.0–8.3)

## 2020-04-07 LAB — CBC WITH DIFFERENTIAL/PLATELET
Basophils Absolute: 0.1 10*3/uL (ref 0.0–0.1)
Basophils Relative: 1.1 % (ref 0.0–3.0)
Eosinophils Absolute: 0.1 10*3/uL (ref 0.0–0.7)
Eosinophils Relative: 0.9 % (ref 0.0–5.0)
HCT: 40 % (ref 39.0–52.0)
Hemoglobin: 13.3 g/dL (ref 13.0–17.0)
Lymphocytes Relative: 18.3 % (ref 12.0–46.0)
Lymphs Abs: 1.4 10*3/uL (ref 0.7–4.0)
MCHC: 33.4 g/dL (ref 30.0–36.0)
MCV: 102.2 fl — ABNORMAL HIGH (ref 78.0–100.0)
Monocytes Absolute: 1.1 10*3/uL — ABNORMAL HIGH (ref 0.1–1.0)
Monocytes Relative: 14.9 % — ABNORMAL HIGH (ref 3.0–12.0)
Neutro Abs: 5 10*3/uL (ref 1.4–7.7)
Neutrophils Relative %: 64.8 % (ref 43.0–77.0)
Platelets: 124 10*3/uL — ABNORMAL LOW (ref 150.0–400.0)
RBC: 3.91 Mil/uL — ABNORMAL LOW (ref 4.22–5.81)
RDW: 13.2 % (ref 11.5–15.5)
WBC: 7.6 10*3/uL (ref 4.0–10.5)

## 2020-04-07 LAB — PSA: PSA: 0.69 ng/mL (ref 0.10–4.00)

## 2020-04-07 NOTE — Progress Notes (Signed)
Patient rescheduled 05/10/20.

## 2020-04-07 NOTE — Assessment & Plan Note (Addendum)
Stable, unchanged. Overdue for follow up with neurosurgery and will schedule appointment for patient. Pending DEXA.

## 2020-04-07 NOTE — Assessment & Plan Note (Signed)
Stable, continue lipitor 10mg 

## 2020-04-07 NOTE — Progress Notes (Signed)
Patient scheduled with both neurology & neurosurgery. I have notified caregiver & she could not make neurosurgery appointment. I have called office back to change & had to leave message for her scheduler, Patty to call me back.

## 2020-04-07 NOTE — Assessment & Plan Note (Addendum)
Appears stable. Reiterated the importance of following with psychiatry. Referral placed. Continue Compliant with depakote,  risperdal, and effexor

## 2020-04-07 NOTE — Assessment & Plan Note (Signed)
Stable. Overdue for follow up and will make follow up appointment for patient. Continue aricept.

## 2020-04-07 NOTE — Assessment & Plan Note (Signed)
Stable. Continue eliquis, metoprolol succinate.

## 2020-04-08 LAB — QUANTIFERON-TB GOLD PLUS

## 2020-04-11 ENCOUNTER — Other Ambulatory Visit: Payer: Self-pay | Admitting: Family

## 2020-04-11 DIAGNOSIS — Z111 Encounter for screening for respiratory tuberculosis: Secondary | ICD-10-CM

## 2020-04-19 ENCOUNTER — Other Ambulatory Visit (INDEPENDENT_AMBULATORY_CARE_PROVIDER_SITE_OTHER): Payer: Medicare Other

## 2020-04-19 ENCOUNTER — Other Ambulatory Visit: Payer: Self-pay

## 2020-04-19 DIAGNOSIS — Z111 Encounter for screening for respiratory tuberculosis: Secondary | ICD-10-CM

## 2020-04-19 DIAGNOSIS — R8290 Unspecified abnormal findings in urine: Secondary | ICD-10-CM

## 2020-04-19 LAB — URINALYSIS, ROUTINE W REFLEX MICROSCOPIC
Bilirubin Urine: NEGATIVE
Hgb urine dipstick: NEGATIVE
Ketones, ur: NEGATIVE
Leukocytes,Ua: NEGATIVE
Nitrite: NEGATIVE
RBC / HPF: NONE SEEN (ref 0–?)
Specific Gravity, Urine: 1.025 (ref 1.000–1.030)
Total Protein, Urine: NEGATIVE
Urine Glucose: NEGATIVE
Urobilinogen, UA: 1 (ref 0.0–1.0)
pH: 7.5 (ref 5.0–8.0)

## 2020-04-20 LAB — URINE CULTURE
MICRO NUMBER:: 11257338
SPECIMEN QUALITY:: ADEQUATE

## 2020-04-22 ENCOUNTER — Ambulatory Visit: Payer: Medicare Other | Admitting: Family

## 2020-04-22 LAB — QUANTIFERON-TB GOLD PLUS

## 2020-04-25 ENCOUNTER — Other Ambulatory Visit: Payer: Self-pay | Admitting: Family

## 2020-04-29 ENCOUNTER — Encounter: Payer: Self-pay | Admitting: Family

## 2020-04-29 ENCOUNTER — Other Ambulatory Visit: Payer: Self-pay | Admitting: Family

## 2020-04-29 DIAGNOSIS — F339 Major depressive disorder, recurrent, unspecified: Secondary | ICD-10-CM

## 2020-05-13 ENCOUNTER — Emergency Department
Admission: EM | Admit: 2020-05-13 | Discharge: 2020-05-14 | Disposition: A | Discharge status: Home / Self Care | Payer: Medicare Other | Attending: Emergency Medicine | Admitting: Emergency Medicine

## 2020-05-13 ENCOUNTER — Emergency Department: Payer: Medicare Other

## 2020-05-13 ENCOUNTER — Other Ambulatory Visit: Payer: Self-pay

## 2020-05-13 DIAGNOSIS — I615 Nontraumatic intracerebral hemorrhage, intraventricular: Secondary | ICD-10-CM

## 2020-05-13 DIAGNOSIS — I129 Hypertensive chronic kidney disease with stage 1 through stage 4 chronic kidney disease, or unspecified chronic kidney disease: Secondary | ICD-10-CM | POA: Diagnosis not present

## 2020-05-13 DIAGNOSIS — Z20822 Contact with and (suspected) exposure to covid-19: Secondary | ICD-10-CM | POA: Diagnosis not present

## 2020-05-13 DIAGNOSIS — S06360A Traumatic hemorrhage of cerebrum, unspecified, without loss of consciousness, initial encounter: Secondary | ICD-10-CM | POA: Diagnosis not present

## 2020-05-13 DIAGNOSIS — Z79899 Other long term (current) drug therapy: Secondary | ICD-10-CM | POA: Insufficient documentation

## 2020-05-13 DIAGNOSIS — N189 Chronic kidney disease, unspecified: Secondary | ICD-10-CM | POA: Insufficient documentation

## 2020-05-13 DIAGNOSIS — W228XXA Striking against or struck by other objects, initial encounter: Secondary | ICD-10-CM | POA: Diagnosis not present

## 2020-05-13 DIAGNOSIS — J449 Chronic obstructive pulmonary disease, unspecified: Secondary | ICD-10-CM | POA: Insufficient documentation

## 2020-05-13 DIAGNOSIS — W19XXXA Unspecified fall, initial encounter: Secondary | ICD-10-CM

## 2020-05-13 DIAGNOSIS — W06XXXA Fall from bed, initial encounter: Secondary | ICD-10-CM | POA: Diagnosis not present

## 2020-05-13 DIAGNOSIS — Z7901 Long term (current) use of anticoagulants: Secondary | ICD-10-CM | POA: Diagnosis not present

## 2020-05-13 DIAGNOSIS — S0990XA Unspecified injury of head, initial encounter: Secondary | ICD-10-CM | POA: Diagnosis present

## 2020-05-13 DIAGNOSIS — G20 Parkinson's disease: Secondary | ICD-10-CM | POA: Insufficient documentation

## 2020-05-13 LAB — PROTIME-INR
INR: 1.3 — ABNORMAL HIGH (ref 0.8–1.2)
Prothrombin Time: 15.7 seconds — ABNORMAL HIGH (ref 11.4–15.2)

## 2020-05-13 LAB — CBC WITH DIFFERENTIAL/PLATELET
Abs Immature Granulocytes: 0.08 10*3/uL — ABNORMAL HIGH (ref 0.00–0.07)
Basophils Absolute: 0 10*3/uL (ref 0.0–0.1)
Basophils Relative: 0 %
Eosinophils Absolute: 0 10*3/uL (ref 0.0–0.5)
Eosinophils Relative: 0 %
HCT: 36.7 % — ABNORMAL LOW (ref 39.0–52.0)
Hemoglobin: 12.3 g/dL — ABNORMAL LOW (ref 13.0–17.0)
Immature Granulocytes: 1 %
Lymphocytes Relative: 10 %
Lymphs Abs: 1.4 10*3/uL (ref 0.7–4.0)
MCH: 34.1 pg — ABNORMAL HIGH (ref 26.0–34.0)
MCHC: 33.5 g/dL (ref 30.0–36.0)
MCV: 101.7 fL — ABNORMAL HIGH (ref 80.0–100.0)
Monocytes Absolute: 2.5 10*3/uL — ABNORMAL HIGH (ref 0.1–1.0)
Monocytes Relative: 17 %
Neutro Abs: 10.2 10*3/uL — ABNORMAL HIGH (ref 1.7–7.7)
Neutrophils Relative %: 72 %
Platelets: 105 10*3/uL — ABNORMAL LOW (ref 150–400)
RBC: 3.61 MIL/uL — ABNORMAL LOW (ref 4.22–5.81)
RDW: 14.1 % (ref 11.5–15.5)
WBC: 14.1 10*3/uL — ABNORMAL HIGH (ref 4.0–10.5)
nRBC: 0 % (ref 0.0–0.2)

## 2020-05-13 LAB — RESP PANEL BY RT-PCR (FLU A&B, COVID) ARPGX2
Influenza A by PCR: NEGATIVE
Influenza B by PCR: NEGATIVE
SARS Coronavirus 2 by RT PCR: NEGATIVE

## 2020-05-13 LAB — BASIC METABOLIC PANEL
Anion gap: 7 (ref 5–15)
BUN: 23 mg/dL (ref 8–23)
CO2: 31 mmol/L (ref 22–32)
Calcium: 8.9 mg/dL (ref 8.9–10.3)
Chloride: 97 mmol/L — ABNORMAL LOW (ref 98–111)
Creatinine, Ser: 0.77 mg/dL (ref 0.61–1.24)
GFR, Estimated: >60 mL/min (ref >60)
Glucose, Bld: 81 mg/dL (ref 70–99)
Potassium: 4 mmol/L (ref 3.5–5.1)
Sodium: 135 mmol/L (ref 135–145)

## 2020-05-13 LAB — APTT: aPTT: 40 seconds — ABNORMAL HIGH (ref 24–36)

## 2020-05-13 MED ORDER — RISPERIDONE 1 MG PO TABS
0.5000 mg | ORAL_TABLET | Freq: Every day | ORAL | Status: DC
  Administered 2020-05-13: 22:00:00 0.5 mg via ORAL
  Filled 2020-05-13: qty 1

## 2020-05-13 MED ORDER — METOPROLOL SUCCINATE ER 50 MG PO TB24
25.0000 mg | ORAL_TABLET | Freq: Every day | ORAL | Status: DC
  Administered 2020-05-13 – 2020-05-14 (×2): 25 mg via ORAL
  Filled 2020-05-13 (×2): qty 1

## 2020-05-13 MED ORDER — DONEPEZIL HCL 5 MG PO TABS
10.0000 mg | ORAL_TABLET | Freq: Every day | ORAL | Status: DC
  Administered 2020-05-13: 22:00:00 10 mg via ORAL
  Filled 2020-05-13: qty 2

## 2020-05-13 MED ORDER — MONTELUKAST SODIUM 10 MG PO TABS
10.0000 mg | ORAL_TABLET | Freq: Every day | ORAL | Status: DC
  Administered 2020-05-13: 22:00:00 10 mg via ORAL
  Filled 2020-05-13 (×2): qty 1

## 2020-05-13 MED ORDER — DIVALPROEX SODIUM 500 MG PO DR TAB
500.0000 mg | DELAYED_RELEASE_TABLET | Freq: Three times a day (TID) | ORAL | Status: DC
  Administered 2020-05-13 – 2020-05-14 (×2): 500 mg via ORAL
  Filled 2020-05-13 (×2): qty 1

## 2020-05-13 MED ORDER — ATORVASTATIN CALCIUM 20 MG PO TABS
10.0000 mg | ORAL_TABLET | Freq: Every day | ORAL | Status: DC
  Administered 2020-05-13: 22:00:00 10 mg via ORAL
  Filled 2020-05-13: qty 1

## 2020-05-13 MED ORDER — VENLAFAXINE HCL ER 150 MG PO CP24
150.0000 mg | ORAL_CAPSULE | Freq: Every day | ORAL | Status: DC
  Administered 2020-05-13: 22:00:00 150 mg via ORAL
  Filled 2020-05-13 (×2): qty 1

## 2020-05-13 NOTE — Discharge Instructions (Addendum)
Stop use of Eliquis for 10 days.  Please see your prescribing doctor in 10 days for discussion as to whether you should restart this due to your risk of falls vs. risk of injury while on a potent blood thinner.  Return to the ER for worsening symptoms, persistent vomiting, lethargy, difficulty breathing or other concerns.

## 2020-05-13 NOTE — ED Notes (Signed)
Pt cleaned up after episode urinary and bowel incontinence. New brief, chuck, and linens placed on pt. Male purewick applied to pt and suction. Pt repositioned in bed.

## 2020-05-13 NOTE — Progress Notes (Signed)
Mr Hosier presented to ED after fall and reportedly at neurologic baseline. CT head showed small amount of right side IVH, no hydrocephalus. He is on Eliquis and his coagulation profile normal except for mildly elevated PTT of 40.   Given these factors, would repeat CT scan in 6-8 hours for evaluation of any progression. If stable, patient is safe for discharge from our perspective but he will need to stay off eliquis for 10 days. Given fall on anticoagulation, will need discussion with PCP regarding safety of continuing vs fall precautions  Cathleen Fears, MD

## 2020-05-13 NOTE — ED Provider Notes (Signed)
-----------------------------------------   11:36 PM on 05/13/2020 -----------------------------------------  Assumed care of patient.  In summary, this is a 64 year old male on Eliquis status post mechanical fall with small intraventricular hemorrhage which had mild expansion on repeat head scan.  Per neurosurgery, performed third CT scan in 8 hours (approximately 4 AM).  If stable, patient may be discharged back to his facility with a plan to hold Eliquis.  Patient currently resting in no acute distress; voices no medical complaints.   ----------------------------------------- 5:31 AM on 05/14/2020 -----------------------------------------  CT head interpreted per Dr. Jeannine Boga:  1. No significant interval change in size and morphology of 4.0 x  2.0 cm hemorrhage centered at the atrium of the right lateral  ventricle. Stable ventricular size and morphology without trapping  or worsened hydrocephalus.  2. Otherwise stable head CT. No other new acute intracranial  abnormality.   Awaken patient to reassess.  Voices no complaints; specifically no complaints for headache, extremity weakness, numbness or tingling.  Normal speech and language.  Follows commands appropriately.  No focal neurological deficits appreciated. MAEx4.  Plan to discharge back to facility.  Hold Eliquis x10 days per plan as discussed with neurosurgery earlier in the day.  Strict return precautions given.  Patient verbalizes understanding agrees with plan of care.     Paulette Blanch, MD 05/14/20 [phone]
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
## 2020-05-13 NOTE — ED Provider Notes (Addendum)
-----------------------------------------   3:22 PM on 05/13/2020 -----------------------------------------  Blood pressure 91/60, pulse 80, temperature 98.8 F (37.1 C), temperature source Oral, resp. rate 15, height 5\' 10"  (1.778 m), weight 70.3 kg, SpO2 98 %.  Assuming care from Dr. Jacqualine Code.  In short, Vincent Stephenson is a 64 y.o. male with a chief complaint of Fall .  Refer to the original H&P for additional details.  The current plan of care is to follow-up repeat head CT for small intraventricular hemorrhage, if stable patient may be discharged home with plan to hold Eliquis.  ----------------------------------------- 8:07 PM on 05/13/2020 -----------------------------------------  Repeat head CT images reviewed and showed mild progression of patient's intraventricular hemorrhage.  On reassessment, he remains awake and alert with no complaints or focal neurologic deficits.  Case discussed with Dr. Lacinda Axon of neurosurgery, who agrees that there has been mild progression of hemorrhage and recommends repeat head CT in another 8 hours to ensure stability.  Given need for ongoing observation, case discussed with hospitalist for admission.    Blake Divine, MD 05/13/20 2007  ----------------------------------------- 8:15 PM on 05/13/2020 -----------------------------------------  In further discussion with the hospitalist, Dr. Sidney Ace, there is concerned that patient would not be appropriate for admission here at [facility].  I once again discussed the case with Dr. Lacinda Axon of neurosurgery, who states that transfer would not be necessary.  We will plan to observe patient here in the ED for his repeat head CT scan at 4 AM, after which if it is stable he may be discharged back to nursing facility.    Blake Divine, MD 05/13/20 2016

## 2020-05-13 NOTE — ED Triage Notes (Addendum)
Pt comes into the ED via EMS from [facility], with c/o rolling out of bed and hitting his head, small hematoma above the right eye, per EMS pt is at baseline of some confusion. CBG 108, 123/74, 84 HR, 100%RA

## 2020-05-14 ENCOUNTER — Emergency Department: Payer: Medicare Other

## 2020-05-14 DIAGNOSIS — S06360A Traumatic hemorrhage of cerebrum, unspecified, without loss of consciousness, initial encounter: Secondary | ICD-10-CM | POA: Diagnosis not present

## 2020-05-14 NOTE — ED Notes (Signed)
Called after-hours number for [place]. Left phone number [phone]) with receptionist for on-call guardian to call back.

## 2020-05-14 NOTE — ED Notes (Signed)
Attempted calling [facility] with no answer and no way to leave voicemail message.

## 2020-05-14 NOTE — ED Notes (Signed)
Asencion Islam, Scientist, physiological of group home called back to ask that pt be transported home by EMS.  Will arrange at this time.

## 2020-05-14 NOTE — ED Notes (Signed)
Group home administrator reached, states she will be here to pick up pt after breakfast.

## 2020-05-14 NOTE — ED Notes (Signed)
AM meds given, breakfast tray provided, pt continues to await group home to pick him up.

## 2020-05-16 ENCOUNTER — Emergency Department
Admission: EM | Admit: 2020-05-16 | Discharge: 2020-05-16 | Disposition: A | Discharge status: Home / Self Care | Payer: Medicare Other | Attending: Emergency Medicine | Admitting: Emergency Medicine

## 2020-05-16 ENCOUNTER — Other Ambulatory Visit: Payer: Self-pay

## 2020-05-16 ENCOUNTER — Emergency Department: Payer: Medicare Other

## 2020-05-16 ENCOUNTER — Telehealth: Payer: Self-pay | Admitting: Family

## 2020-05-16 DIAGNOSIS — Z79899 Other long term (current) drug therapy: Secondary | ICD-10-CM | POA: Insufficient documentation

## 2020-05-16 DIAGNOSIS — G20 Parkinson's disease: Secondary | ICD-10-CM | POA: Diagnosis not present

## 2020-05-16 DIAGNOSIS — I615 Nontraumatic intracerebral hemorrhage, intraventricular: Secondary | ICD-10-CM | POA: Diagnosis present

## 2020-05-16 DIAGNOSIS — Z20822 Contact with and (suspected) exposure to covid-19: Secondary | ICD-10-CM | POA: Insufficient documentation

## 2020-05-16 DIAGNOSIS — N189 Chronic kidney disease, unspecified: Secondary | ICD-10-CM | POA: Insufficient documentation

## 2020-05-16 DIAGNOSIS — J449 Chronic obstructive pulmonary disease, unspecified: Secondary | ICD-10-CM | POA: Insufficient documentation

## 2020-05-16 DIAGNOSIS — Z7901 Long term (current) use of anticoagulants: Secondary | ICD-10-CM | POA: Diagnosis not present

## 2020-05-16 DIAGNOSIS — I129 Hypertensive chronic kidney disease with stage 1 through stage 4 chronic kidney disease, or unspecified chronic kidney disease: Secondary | ICD-10-CM | POA: Diagnosis not present

## 2020-05-16 DIAGNOSIS — I4891 Unspecified atrial fibrillation: Secondary | ICD-10-CM | POA: Insufficient documentation

## 2020-05-16 DIAGNOSIS — W19XXXA Unspecified fall, initial encounter: Secondary | ICD-10-CM | POA: Insufficient documentation

## 2020-05-16 LAB — BASIC METABOLIC PANEL
Anion gap: 8 (ref 5–15)
BUN: 29 mg/dL — ABNORMAL HIGH (ref 8–23)
CO2: 31 mmol/L (ref 22–32)
Calcium: 8.7 mg/dL — ABNORMAL LOW (ref 8.9–10.3)
Chloride: 98 mmol/L (ref 98–111)
Creatinine, Ser: 0.74 mg/dL (ref 0.61–1.24)
GFR, Estimated: >60 mL/min (ref >60)
Glucose, Bld: 86 mg/dL (ref 70–99)
Potassium: 4.1 mmol/L (ref 3.5–5.1)
Sodium: 137 mmol/L (ref 135–145)

## 2020-05-16 LAB — CBC
HCT: 47.8 % (ref 39.0–52.0)
Hemoglobin: 16.2 g/dL (ref 13.0–17.0)
MCH: 34 pg (ref 26.0–34.0)
MCHC: 33.9 g/dL (ref 30.0–36.0)
MCV: 100.4 fL — ABNORMAL HIGH (ref 80.0–100.0)
Platelets: 164 10*3/uL (ref 150–400)
RBC: 4.76 MIL/uL (ref 4.22–5.81)
RDW: 14.4 % (ref 11.5–15.5)
WBC: 9.6 10*3/uL (ref 4.0–10.5)
nRBC: 0 % (ref 0.0–0.2)

## 2020-05-16 LAB — HEPATIC FUNCTION PANEL
ALT: 20 U/L (ref 0–44)
AST: 27 U/L (ref 15–41)
Albumin: 2.8 g/dL — ABNORMAL LOW (ref 3.5–5.0)
Alkaline Phosphatase: 43 U/L (ref 38–126)
Bilirubin, Direct: 0.2 mg/dL (ref 0.0–0.2)
Indirect Bilirubin: 0.7 mg/dL (ref 0.3–0.9)
Total Bilirubin: 0.9 mg/dL (ref 0.3–1.2)
Total Protein: 6.4 g/dL — ABNORMAL LOW (ref 6.5–8.1)

## 2020-05-16 LAB — PROTIME-INR
INR: 1.1 (ref 0.8–1.2)
Prothrombin Time: 13.9 seconds (ref 11.4–15.2)

## 2020-05-16 LAB — TROPONIN I (HIGH SENSITIVITY)
Troponin I (High Sensitivity): 21 ng/L — ABNORMAL HIGH (ref <18)
Troponin I (High Sensitivity): 17 ng/L (ref <18)

## 2020-05-16 NOTE — ED Notes (Signed)
Patient resting comfortably in bed at this time. No needs expressed at this time. Will continue to monitor.

## 2020-05-16 NOTE — ED Notes (Signed)
Spoke with [employer] with [employer] and patient's legal guardian. She is aware of patient's discharge.

## 2020-05-16 NOTE — ED Notes (Signed)
Patient unable to sign for self for discharge.

## 2020-05-16 NOTE — ED Triage Notes (Signed)
Pt comes into the ED via EMS from [facility] with c/o multiple falls, pt was just here 3 days ago and had a head bleed. Pt is alert on arrival. Denies any pain.

## 2020-05-16 NOTE — ED Notes (Signed)
No IV success x2 from this nurse and 1 unsuccessful attempt by second nurse.
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
## 2020-05-16 NOTE — ED Notes (Signed)
Spoke with Vincent Stephenson. She states that patient has been "acting different" since getting discharged on Christmas.

## 2020-05-16 NOTE — ED Notes (Signed)
Patient resting comfortably at this time. Denies any needs. Will continue to monitor.

## 2020-05-16 NOTE — Telephone Encounter (Signed)
Patient's care provider called in wanted to know if Vincent Stephenson want to order therapy for patient because he has had a couple of fails and has been sent to Hospital for this and  the hospital stated that the patient has some bleed on his brain she has sent him to the hospital twice christmas eve and today 12-27.

## 2020-05-16 NOTE — Discharge Instructions (Addendum)
Patient must be supervised at all times as he has an intraventricular hemorrhage and is high risk for progression if he falls again.  He should not be left unsupervised at all and must be maintained on fall risk  precautions at all times.

## 2020-05-17 ENCOUNTER — Other Ambulatory Visit: Payer: Self-pay | Admitting: Nurse Practitioner

## 2020-05-17 ENCOUNTER — Other Ambulatory Visit (HOSPITAL_COMMUNITY): Payer: Self-pay | Admitting: Nurse Practitioner

## 2020-05-17 DIAGNOSIS — Z8781 Personal history of (healed) traumatic fracture: Secondary | ICD-10-CM

## 2020-05-17 DIAGNOSIS — I615 Nontraumatic intracerebral hemorrhage, intraventricular: Secondary | ICD-10-CM

## 2020-05-17 LAB — SARS CORONAVIRUS 2 (TAT 6-24 HRS): SARS Coronavirus 2: NEGATIVE

## 2020-05-18 ENCOUNTER — Telehealth: Payer: Self-pay | Admitting: Family

## 2020-05-18 NOTE — Telephone Encounter (Signed)
Call caregiver back in reference to request for PT/OT Would she like a referral for this? Or is she pursuing skilled nursing?  Does he have an appt with Dr Adriana Simas, neurosurgery?

## 2020-05-18 NOTE — Telephone Encounter (Signed)
Call caregiver We have incontinence supply request Please ask what we need to check here for patient and then fax

## 2020-05-18 NOTE — Telephone Encounter (Signed)
She will be perusing skilled nursing & was calling case worker to get that started. She has h/fu scheduled for patient so you can evaluate him. She may need FL-2 form filled out. He did see Dr. [name] office, but saw another provider since Dr. Adriana Simas was not there. She said that brain bleed had lessened? She did not want PT/OT referral at this time.

## 2020-05-18 NOTE — Telephone Encounter (Signed)
Noted He saw christine zdeb yesterday

## 2020-05-18 NOTE — Telephone Encounter (Signed)
Just FYI I have ca;led caregiver & will fax.  I have patient scheduled 1/7 for hospital f/u. She stated that given patient's falls & that since Christmas Eve he has not walked she feels that he needs to go to a more skilled facility for care. She is reaching out to his appointed social worker to see if she can start this process.

## 2020-05-24 ENCOUNTER — Other Ambulatory Visit: Payer: Self-pay

## 2020-05-24 ENCOUNTER — Ambulatory Visit
Admission: RE | Admit: 2020-05-24 | Discharge: 2020-05-24 | Disposition: A | Discharge status: Home / Self Care | Payer: Medicare Other | Source: Ambulatory Visit | Attending: Nurse Practitioner | Admitting: Nurse Practitioner

## 2020-05-24 ENCOUNTER — Telehealth: Payer: Self-pay

## 2020-05-24 DIAGNOSIS — I615 Nontraumatic intracerebral hemorrhage, intraventricular: Secondary | ICD-10-CM | POA: Diagnosis not present

## 2020-05-24 DIAGNOSIS — Z8781 Personal history of (healed) traumatic fracture: Secondary | ICD-10-CM | POA: Insufficient documentation

## 2020-05-24 NOTE — Telephone Encounter (Signed)
Vincent Stephenson, caregiver called she said they had CT of head done today. They saw [name] & she said that she was referring to you if patient should stop or continue Eliquis. Not sure if you have been sent anything by them? Caregiver was upset that decision was not made by neurosurgery. Pr has been holding Eliquis since Christmas Eve when fall occurred. He does see you 05/27/20. She is concerned for the liability of patient. Please advise?

## 2020-05-26 NOTE — Telephone Encounter (Signed)
Dr Sandie Ano,  I would appreciate your advice on eliquis. Patient sees you on 06/20/20.   Should we continue to hold eliquis until he sees you?  Patient fell 05/13/20 and suffered intraventricular hemorrhage; consulted with neurosurgery, Dr Chaya Jan and discontinued eliquis 5mg  bid for 10 days,   Per neurosurgery note from yesterday 05/25/20:  In regard to his blood thinner, at this point it is clear from a neurosurgical standpoint to restart this blood thinner however, his primary care provider must weigh the risks versus the benefits of restarting this, as he has a high risk for falls and a fall while on a blood thinner could lead to a life-threatening brain bleed.  Updated CT head 05/24/20   Neurosurgery has also referred him to neurology for management of parkinson's.

## 2020-05-27 ENCOUNTER — Other Ambulatory Visit: Payer: Self-pay

## 2020-05-27 ENCOUNTER — Encounter: Payer: Self-pay | Admitting: Family

## 2020-05-27 ENCOUNTER — Ambulatory Visit (INDEPENDENT_AMBULATORY_CARE_PROVIDER_SITE_OTHER): Payer: Medicare Other | Admitting: Family

## 2020-05-27 VITALS — BP 122/78 | HR 81 | Temp 97.5°F

## 2020-05-27 DIAGNOSIS — I619 Nontraumatic intracerebral hemorrhage, unspecified: Secondary | ICD-10-CM | POA: Insufficient documentation

## 2020-05-27 DIAGNOSIS — F319 Bipolar disorder, unspecified: Secondary | ICD-10-CM

## 2020-05-27 DIAGNOSIS — S06360D Traumatic hemorrhage of cerebrum, unspecified, without loss of consciousness, subsequent encounter: Secondary | ICD-10-CM

## 2020-05-27 DIAGNOSIS — Z111 Encounter for screening for respiratory tuberculosis: Secondary | ICD-10-CM

## 2020-05-27 DIAGNOSIS — I4891 Unspecified atrial fibrillation: Secondary | ICD-10-CM | POA: Diagnosis not present

## 2020-05-27 DIAGNOSIS — G20 Parkinson's disease: Secondary | ICD-10-CM | POA: Diagnosis not present
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
## 2020-05-27 NOTE — Telephone Encounter (Signed)
I spoke with coretta and advised of Dr Gladstone Lighter advise to HOLD eliquis until seen by Dr Olena Leatherwood, please call coretta and sch and two step TB screen. He was unable to give blood today

## 2020-05-27 NOTE — Patient Instructions (Signed)
Continue to hold eliquis until I speak with Dr Charlestine Night.   Close follow up

## 2020-05-27 NOTE — Assessment & Plan Note (Signed)
In setting of fall, and at times difficult mobility, I have provided Vincent Stephenson with prescription for wheelchair and hospital bed. Patient is to sleep in bed for safety and may have an occasional bed bath IF needed. Otherwise, to promote strength, avoid decompensation, patient needs to be continue to be out of bed during the day, ambulating, and having showers with assistance. Caregiver and I discussed this in detail and are in agreement with plan and how hospital bed is to be used.

## 2020-05-27 NOTE — Telephone Encounter (Signed)
Patient scheduled 1/11 at 3p & 1/13 at 3p.

## 2020-05-27 NOTE — Assessment & Plan Note (Signed)
Reviewed ED visits extensively and consult with Zdeb. Had long and thoughtful discussion regarding risk and benefit or reinstating eliquis. Please see note regarding atrial fib. Close followup.

## 2020-05-27 NOTE — Assessment & Plan Note (Addendum)
Compliant with toprol 25mg . Asymptomatic today. Have sent Dr Charlestine Night a call note as well as secure chat today to seek advice in regards to holding Eliquis. Based on CHADSVASC score of 1 , he is low risk for stroke in setting of fall and likely progressive dementia , parkinson's, I think the decision to restart Eliquis should be made very thoughtfully. He has high risk of ICH if suffers another fall. Advised to continue holding eliquis until discussion with Dr Charlestine Night.

## 2020-05-27 NOTE — Assessment & Plan Note (Addendum)
Appears stable. Again, advised that patient needs to establish with psychiatry and I have sent Vernell Morgans a staff message as caregiver didn't receive paperwork, phone call. Will follow. Continue depakote 500mg  TID, risperdal 0.5mg  qhs, effexor 150mg  qd. Close follow up.

## 2020-05-27 NOTE — Addendum Note (Signed)
Addended by: Tor Netters I on: 05/27/2020 03:16 PM   Modules accepted: Orders

## 2020-05-30 ENCOUNTER — Other Ambulatory Visit: Payer: Self-pay | Admitting: Family

## 2020-05-30 DIAGNOSIS — F339 Major depressive disorder, recurrent, unspecified: Secondary | ICD-10-CM

## 2020-05-31 ENCOUNTER — Other Ambulatory Visit: Payer: Self-pay

## 2020-05-31 ENCOUNTER — Ambulatory Visit (INDEPENDENT_AMBULATORY_CARE_PROVIDER_SITE_OTHER): Payer: Medicare Other

## 2020-05-31 DIAGNOSIS — Z111 Encounter for screening for respiratory tuberculosis: Secondary | ICD-10-CM

## 2020-05-31 NOTE — Progress Notes (Signed)
Patient presented for PPD Placement to Right Forearm, patient voiced no concerns nor showed any signs of distress during injection. Patient will schedule to return for a reading between the times of 3:15pm on 1/13- 3:15pm on 06/03/20.  0.61ml Tuberculin Purified Protein Derivative ([place])  NDC 56256-389-37 EXP 08/12/2021 [id number]

## 2020-06-02 ENCOUNTER — Ambulatory Visit (INDEPENDENT_AMBULATORY_CARE_PROVIDER_SITE_OTHER): Payer: Medicare Other

## 2020-06-02 ENCOUNTER — Other Ambulatory Visit: Payer: Self-pay

## 2020-06-02 DIAGNOSIS — Z111 Encounter for screening for respiratory tuberculosis: Secondary | ICD-10-CM | POA: Diagnosis not present

## 2020-06-02 LAB — TB SKIN TEST
Induration: 0 mm
TB Skin Test: NEGATIVE

## 2020-06-02 NOTE — Progress Notes (Addendum)
Patient presented for PPD read for right arm.  Results: Negative/0MM

## 2020-06-03 ENCOUNTER — Telehealth: Payer: Self-pay

## 2020-06-03 NOTE — Telephone Encounter (Signed)
Pt's caregiver Asencion Islam called & asked if we have heard from [employer] in [place]. I told her that we had not or hadn't received any paperwork from them. She said that for hospital bed that they needed his OV notes & in the notes stating that hospital bed was needed. I advised that I did not mind sending notes or filling out paperwork. I called LM for someone to call me back at [facility] to call back to see if they needed additional paperwork or if they just needed OV notes.

## 2020-06-08 NOTE — Telephone Encounter (Signed)
[employer] faxed over information to be filled out. Placed in Margaret's box.

## 2020-06-14 NOTE — Telephone Encounter (Signed)
I called and spoke with Lorriane Shire & it appeared that possibly pages stuck together, so form for wheelchair wasn't sent. I have faxed again to [employer].

## 2020-06-14 NOTE — Telephone Encounter (Signed)
Vincent Stephenson with [employer] called and states that they received the order for the bed but not the chair. Her number is [phone]

## 2020-06-20 ENCOUNTER — Ambulatory Visit (INDEPENDENT_AMBULATORY_CARE_PROVIDER_SITE_OTHER): Payer: Medicare Other

## 2020-06-20 ENCOUNTER — Ambulatory Visit (INDEPENDENT_AMBULATORY_CARE_PROVIDER_SITE_OTHER): Payer: Medicare Other | Admitting: Cardiology

## 2020-06-20 ENCOUNTER — Other Ambulatory Visit: Payer: Self-pay

## 2020-06-20 ENCOUNTER — Encounter: Payer: Self-pay | Admitting: Cardiology

## 2020-06-20 VITALS — BP 100/60 | HR 96 | Ht 69.0 in | Wt 151.1 lb

## 2020-06-20 DIAGNOSIS — Z111 Encounter for screening for respiratory tuberculosis: Secondary | ICD-10-CM

## 2020-06-20 DIAGNOSIS — D696 Thrombocytopenia, unspecified: Secondary | ICD-10-CM

## 2020-06-20 DIAGNOSIS — E7800 Pure hypercholesterolemia, unspecified: Secondary | ICD-10-CM | POA: Diagnosis not present

## 2020-06-20 DIAGNOSIS — I4891 Unspecified atrial fibrillation: Secondary | ICD-10-CM | POA: Diagnosis not present
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
## 2020-06-20 NOTE — Progress Notes (Signed)
Patient presented for PPD Placement to left forearm, patient voiced no concerns nor showed any signs of distress during injection.

## 2020-06-20 NOTE — Patient Instructions (Signed)
Medication Instructions:   Your physician has recommended you make the following change in your medication:   Stop Eliquis.  *If you need a refill on your cardiac medications before your next appointment, please call your pharmacy*   Lab Work: None Ordered If you have labs (blood work) drawn today and your tests are completely normal, you will receive your results only by: Marland Kitchen MyChart Message (if you have MyChart) OR . A paper copy in the mail If you have any lab test that is abnormal or we need to change your treatment, we will call you to review the results.   Testing/Procedures: None Ordered   Follow-Up: At [facility], you and your health needs are our priority.  As part of our continuing mission to provide you with exceptional heart care, we have created designated Provider Care Teams.  These Care Teams include your primary Cardiologist (physician) and Advanced Practice Providers (APPs -  Physician Assistants and Nurse Practitioners) who all work together to provide you with the care you need, when you need it.  We recommend signing up for the patient portal called "MyChart".  Sign up information is provided on this After Visit Summary.  MyChart is used to connect with patients for Virtual Visits (Telemedicine).  Patients are able to view lab/test results, encounter notes, upcoming appointments, etc.  Non-urgent messages can be sent to your provider as well.   To learn more about what you can do with MyChart, go to [email or web address].    Your next appointment:   1 year(s)  The format for your next appointment:   In Person  Provider:   Kate Sable, MD   Other Instructions

## 2020-06-23 ENCOUNTER — Ambulatory Visit (INDEPENDENT_AMBULATORY_CARE_PROVIDER_SITE_OTHER): Payer: Medicare Other

## 2020-06-23 ENCOUNTER — Other Ambulatory Visit: Payer: Self-pay

## 2020-06-23 DIAGNOSIS — Z111 Encounter for screening for respiratory tuberculosis: Secondary | ICD-10-CM

## 2020-06-23 LAB — TB SKIN TEST: TB Skin Test: NEGATIVE

## 2020-06-23 NOTE — Progress Notes (Signed)
Patient presented for PPD read to left forearm.

## 2020-06-27 ENCOUNTER — Other Ambulatory Visit: Payer: Self-pay | Admitting: Family

## 2020-06-27 DIAGNOSIS — F339 Major depressive disorder, recurrent, unspecified: Secondary | ICD-10-CM

## 2020-07-12 ENCOUNTER — Ambulatory Visit (INDEPENDENT_AMBULATORY_CARE_PROVIDER_SITE_OTHER): Payer: Medicare Other | Admitting: Family

## 2020-07-12 ENCOUNTER — Other Ambulatory Visit: Payer: Self-pay

## 2020-07-12 ENCOUNTER — Encounter: Payer: Self-pay | Admitting: Family

## 2020-07-12 VITALS — BP 108/70 | HR 52 | Temp 98.5°F

## 2020-07-12 DIAGNOSIS — Z23 Encounter for immunization: Secondary | ICD-10-CM | POA: Diagnosis not present

## 2020-07-12 DIAGNOSIS — R413 Other amnesia: Secondary | ICD-10-CM

## 2020-07-12 DIAGNOSIS — E7800 Pure hypercholesterolemia, unspecified: Secondary | ICD-10-CM

## 2020-07-12 DIAGNOSIS — I4891 Unspecified atrial fibrillation: Secondary | ICD-10-CM

## 2020-07-12 DIAGNOSIS — F319 Bipolar disorder, unspecified: Secondary | ICD-10-CM

## 2020-07-12 LAB — LIPID PANEL
Cholesterol: 113 mg/dL (ref 0–200)
HDL: 30.6 mg/dL — ABNORMAL LOW (ref >39.00)
LDL Cholesterol: 67 mg/dL (ref 0–99)
NonHDL: 81.93
Total CHOL/HDL Ratio: 4
Triglycerides: 73 mg/dL (ref 0.0–149.0)
VLDL: 14.6 mg/dL (ref 0.0–40.0)

## 2020-07-12 LAB — COMPREHENSIVE METABOLIC PANEL
ALT: 6 U/L (ref 0–53)
AST: 13 U/L (ref 0–37)
Albumin: 3.3 g/dL — ABNORMAL LOW (ref 3.5–5.2)
Alkaline Phosphatase: 53 U/L (ref 39–117)
BUN: 26 mg/dL — ABNORMAL HIGH (ref 6–23)
CO2: 31 mEq/L (ref 19–32)
Calcium: 9.2 mg/dL (ref 8.4–10.5)
Chloride: 98 mEq/L (ref 96–112)
Creatinine, Ser: 0.79 mg/dL (ref 0.40–1.50)
GFR: 93.83 mL/min (ref >60.00)
Glucose, Bld: 80 mg/dL (ref 70–99)
Potassium: 4.3 mEq/L (ref 3.5–5.1)
Sodium: 136 mEq/L (ref 135–145)
Total Bilirubin: 0.5 mg/dL (ref 0.2–1.2)
Total Protein: 6.8 g/dL (ref 6.0–8.3)

## 2020-07-12 NOTE — Patient Instructions (Addendum)
Please call [facility]) and schedule an appointment [phone] due to history of bipolar.   Nice to see you!

## 2020-07-12 NOTE — Assessment & Plan Note (Addendum)
Appears stable although difficult to assess in setting of dementia. Again provided phone number to caregiver so she may establish care. Continue effexor 150mg , depakote 500mg  TID, risperdal 0.5mg  qhs

## 2020-07-12 NOTE — Assessment & Plan Note (Signed)
Anticipate controlled. Continue lipitor 10mg 

## 2020-07-12 NOTE — Assessment & Plan Note (Signed)
Appears unchanged however I anticipate gradual worsening. Continue aricept 10mg .

## 2020-07-12 NOTE — Assessment & Plan Note (Addendum)
No longer on eliquis. Continue toprol 25mg .
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
## 2020-07-26 ENCOUNTER — Encounter: Payer: Self-pay | Admitting: Medical Oncology

## 2020-07-26 ENCOUNTER — Other Ambulatory Visit: Payer: Self-pay

## 2020-07-26 ENCOUNTER — Emergency Department
Admission: EM | Admit: 2020-07-26 | Discharge: 2020-07-26 | Disposition: A | Discharge status: Home / Self Care | Payer: Medicare Other | Attending: Emergency Medicine | Admitting: Emergency Medicine

## 2020-07-26 ENCOUNTER — Emergency Department: Payer: Medicare Other

## 2020-07-26 DIAGNOSIS — N189 Chronic kidney disease, unspecified: Secondary | ICD-10-CM | POA: Diagnosis not present

## 2020-07-26 DIAGNOSIS — I482 Chronic atrial fibrillation, unspecified: Secondary | ICD-10-CM | POA: Diagnosis not present

## 2020-07-26 DIAGNOSIS — Z7901 Long term (current) use of anticoagulants: Secondary | ICD-10-CM | POA: Diagnosis not present

## 2020-07-26 DIAGNOSIS — G20 Parkinson's disease: Secondary | ICD-10-CM | POA: Insufficient documentation

## 2020-07-26 DIAGNOSIS — Z79899 Other long term (current) drug therapy: Secondary | ICD-10-CM | POA: Diagnosis not present

## 2020-07-26 DIAGNOSIS — I129 Hypertensive chronic kidney disease with stage 1 through stage 4 chronic kidney disease, or unspecified chronic kidney disease: Secondary | ICD-10-CM | POA: Diagnosis not present

## 2020-07-26 DIAGNOSIS — J449 Chronic obstructive pulmonary disease, unspecified: Secondary | ICD-10-CM | POA: Insufficient documentation

## 2020-07-26 DIAGNOSIS — R531 Weakness: Secondary | ICD-10-CM | POA: Diagnosis present

## 2020-07-26 LAB — CBC WITH DIFFERENTIAL/PLATELET
Abs Immature Granulocytes: 0.07 10*3/uL (ref 0.00–0.07)
Basophils Absolute: 0 10*3/uL (ref 0.0–0.1)
Basophils Relative: 0 %
Eosinophils Absolute: 0.1 10*3/uL (ref 0.0–0.5)
Eosinophils Relative: 1 %
HCT: 38.1 % — ABNORMAL LOW (ref 39.0–52.0)
Hemoglobin: 12.6 g/dL — ABNORMAL LOW (ref 13.0–17.0)
Immature Granulocytes: 1 %
Lymphocytes Relative: 10 %
Lymphs Abs: 1.1 10*3/uL (ref 0.7–4.0)
MCH: 34.5 pg — ABNORMAL HIGH (ref 26.0–34.0)
MCHC: 33.1 g/dL (ref 30.0–36.0)
MCV: 104.4 fL — ABNORMAL HIGH (ref 80.0–100.0)
Monocytes Absolute: 1.9 10*3/uL — ABNORMAL HIGH (ref 0.1–1.0)
Monocytes Relative: 17 %
Neutro Abs: 8.3 10*3/uL — ABNORMAL HIGH (ref 1.7–7.7)
Neutrophils Relative %: 71 %
Platelets: 276 10*3/uL (ref 150–400)
RBC: 3.65 MIL/uL — ABNORMAL LOW (ref 4.22–5.81)
RDW: 14.4 % (ref 11.5–15.5)
WBC: 11.4 10*3/uL — ABNORMAL HIGH (ref 4.0–10.5)
nRBC: 0 % (ref 0.0–0.2)

## 2020-07-26 LAB — COMPREHENSIVE METABOLIC PANEL
ALT: 20 U/L (ref 0–44)
AST: 52 U/L — ABNORMAL HIGH (ref 15–41)
Albumin: 2.7 g/dL — ABNORMAL LOW (ref 3.5–5.0)
Alkaline Phosphatase: 49 U/L (ref 38–126)
Anion gap: 9 (ref 5–15)
BUN: 25 mg/dL — ABNORMAL HIGH (ref 8–23)
CO2: 28 mmol/L (ref 22–32)
Calcium: 9.2 mg/dL (ref 8.9–10.3)
Chloride: 99 mmol/L (ref 98–111)
Creatinine, Ser: 0.83 mg/dL (ref 0.61–1.24)
GFR, Estimated: >60 mL/min (ref >60)
Glucose, Bld: 93 mg/dL (ref 70–99)
Potassium: 4.1 mmol/L (ref 3.5–5.1)
Sodium: 136 mmol/L (ref 135–145)
Total Bilirubin: 0.6 mg/dL (ref 0.3–1.2)
Total Protein: 7.3 g/dL (ref 6.5–8.1)

## 2020-07-26 LAB — TROPONIN I (HIGH SENSITIVITY)
Troponin I (High Sensitivity): 164 ng/L (ref <18)
Troponin I (High Sensitivity): 15 ng/L (ref <18)

## 2020-07-26 MED ORDER — DILTIAZEM HCL 60 MG PO TABS
30.0000 mg | ORAL_TABLET | Freq: Once | ORAL | Status: AC
  Administered 2020-07-26: 30 mg via ORAL
  Filled 2020-07-26: qty 1

## 2020-07-26 MED ORDER — DILTIAZEM HCL 25 MG/5ML IV SOLN
10.0000 mg | Freq: Once | INTRAVENOUS | Status: AC
  Administered 2020-07-26: 10 mg via INTRAVENOUS
  Filled 2020-07-26: qty 5

## 2020-07-26 MED ORDER — SODIUM CHLORIDE 0.9 % IV BOLUS
500.0000 mL | Freq: Once | INTRAVENOUS | Status: AC
  Administered 2020-07-26: 500 mL via INTRAVENOUS

## 2020-07-26 NOTE — ED Triage Notes (Signed)
PT from [facility] via ems with reports that pt has been leaning to the right today. No other deficits per staff. Pt was normal yesterday.

## 2020-07-26 NOTE — ED Notes (Signed)
Pt oriented to person only at this time.  Updated pt transport pending.  TV turned on per pt request- denies any additional needs, questions, concerns at this time.

## 2020-07-26 NOTE — ED Notes (Signed)
This RN spoke to [employer] and gave report. Will call EMS for transport per [place].

## 2020-07-26 NOTE — ED Notes (Signed)
Pt lying awake in bed at this time.  No acute distress. RR even and labored on RA with symmetrical rise and fall of chest.  Pt awaits transport back to [facility]; no eta provided.  Will continue to monitor for acute changes and maintain plan of care as pt continues to await transport.
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
## 2020-07-26 NOTE — ED Triage Notes (Signed)
Reported via EMS. Patient was leaning to the right in his wheelchair. Stroke screen negative. From [facility].

## 2020-07-29 ENCOUNTER — Other Ambulatory Visit: Payer: Self-pay | Admitting: Family

## 2020-07-29 DIAGNOSIS — F339 Major depressive disorder, recurrent, unspecified: Secondary | ICD-10-CM

## 2020-07-30 ENCOUNTER — Other Ambulatory Visit: Payer: Self-pay | Admitting: Family

## 2021-01-05 ENCOUNTER — Telehealth: Payer: Self-pay | Admitting: Family

## 2021-01-05 NOTE — Telephone Encounter (Signed)
[id number] to confirm patients upcoming appointment on Monday,patient is deceased.

## 2021-01-09 ENCOUNTER — Ambulatory Visit: Payer: Medicare Other | Admitting: Family

## 2021-11-12 IMAGING — MR MR HEAD W/O CM
12 series · 44 of 48 positions shown · non-contrast
Comparison: Head CT 07/22/2019

CLINICAL DATA: Tremors in the hands and loss of coordination over
the last year.

EXAM:
MRI HEAD WITHOUT CONTRAST
TECHNIQUE: Multiplanar, multiecho pulse sequences of the brain and surrounding
structures were obtained without intravenous contrast.

[Series 5: ax dwi_tracew · axial · 3.0mm · 0.60mm/px · z∈[-78,+74]mm · 4 of 48 slices shown]
[im 1/48]
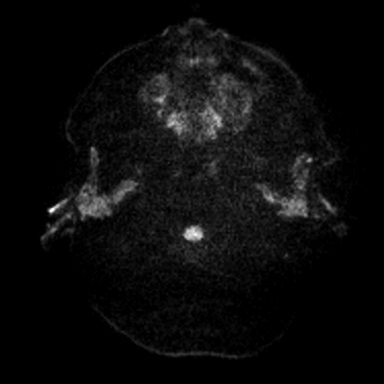
[im 16/48]
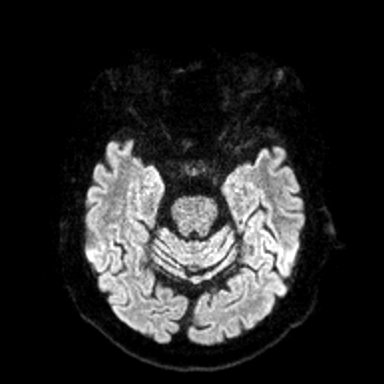
[im 32/48]
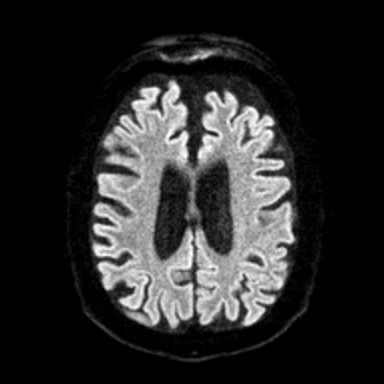
[im 48/48]
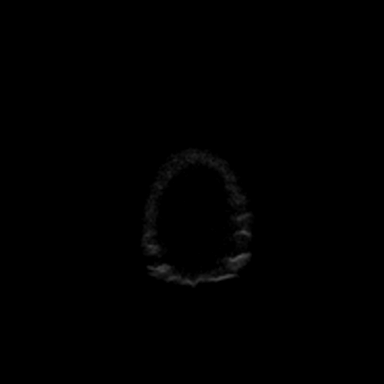

[Series 6: ax dwi_adc · axial · 3.0mm · 0.60mm/px · z∈[-78,+74]mm · 3 of 48 slices shown]
[im 1/48]
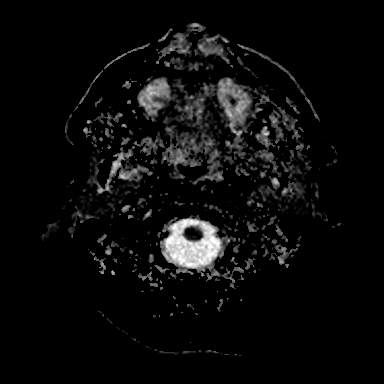
[im 24/48]
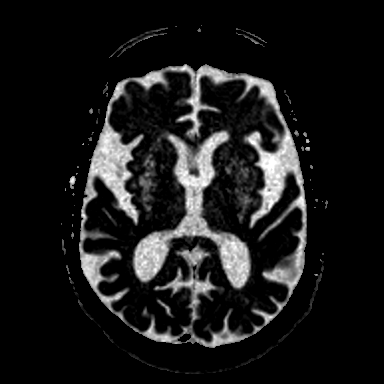
[im 48/48]
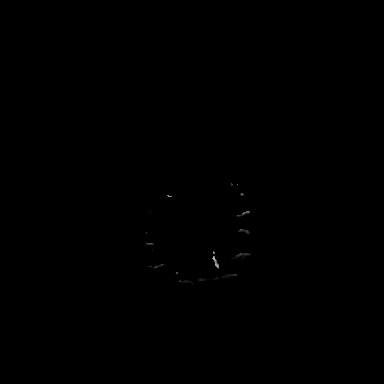

[Series 7: cor dwi_tracew · coronal · 5.0mm · 0.60mm/px · 3 of 40 slices shown]
[im 1/40]
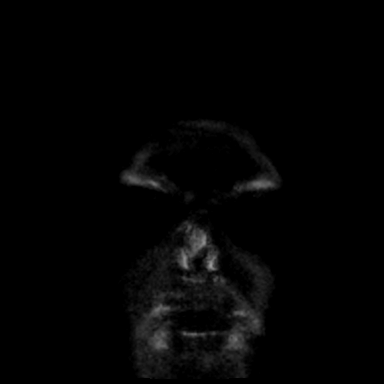
[im 20/40]
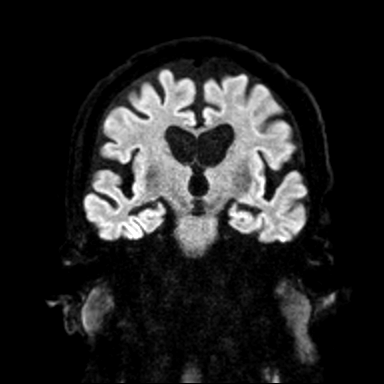
[im 40/40]
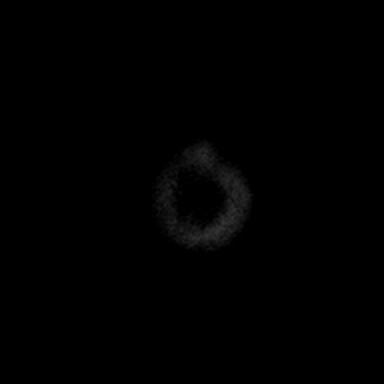

[Series 8: cor dwi_adc · coronal · 5.0mm · 0.60mm/px · 3 of 40 slices shown]
[im 1/40]
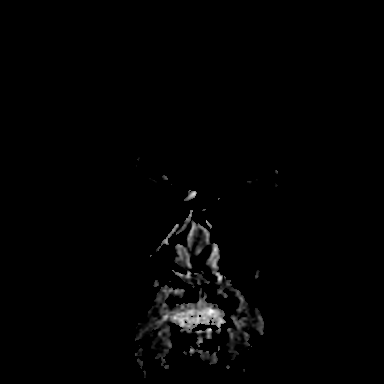
[im 20/40]
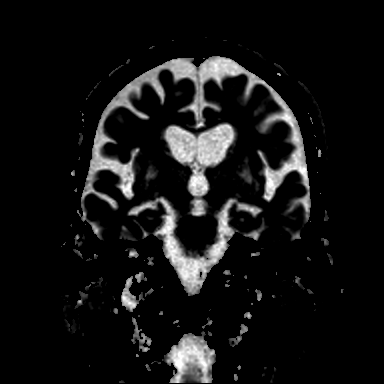
[im 40/40]
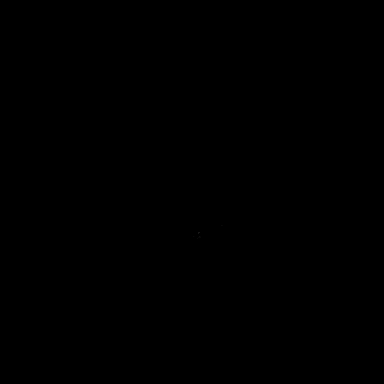

[Series 9: T1 · sagittal · 5.0mm · 0.62mm/px · 2 of 25 slices shown (1 of 2)]
[im 1/25]
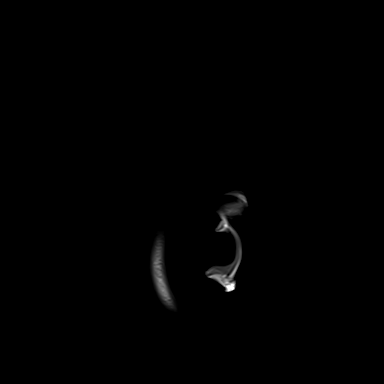
[im 25/25]
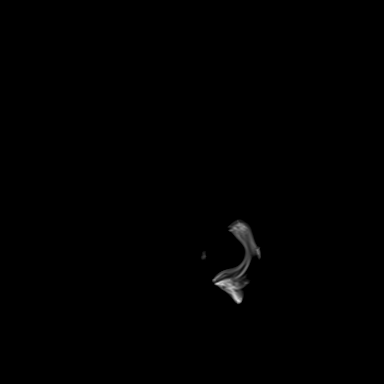

[Series 10: T2 · axial · 5.0mm · 0.53mm/px · z∈[-79,+74]mm · 2 of 27 slices shown (1 of 2)]
[im 1/27]
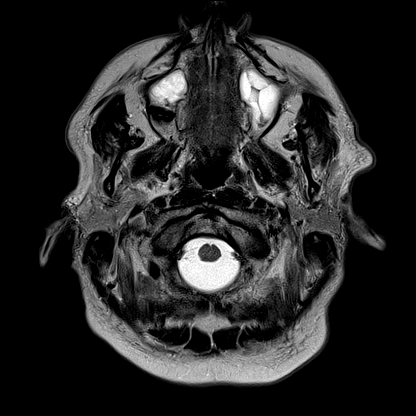
[im 27/27]
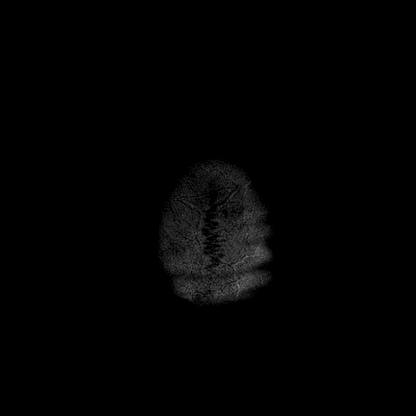

[Series 11: mag_images · axial · 3.0mm · 0.90mm/px · z∈[-91,+82]mm · 4 of 60 slices shown]
[im 1/60]
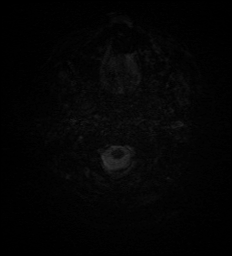
[im 20/60]
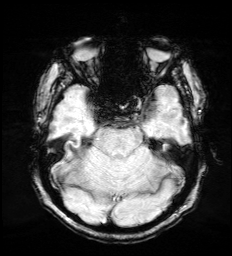
[im 40/60]
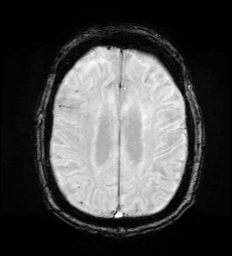
[im 60/60]
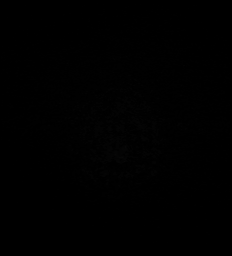

[Series 12: pha_images · axial · 3.0mm · 0.90mm/px · z∈[-91,+76]mm · 4 of 58 slices shown]
[im 1/58]
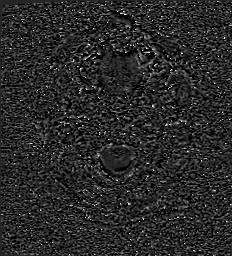
[im 20/58]
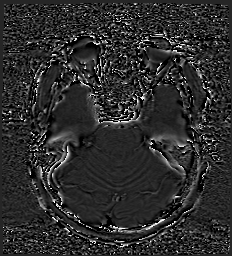
[im 39/58]
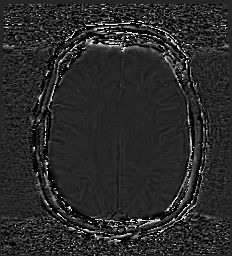
[im 58/58]
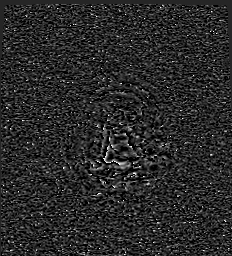

[Series 13: swi_images · axial · 3.0mm · 0.90mm/px · z∈[-91,+82]mm · 4 of 60 slices shown]
[im 1/60]
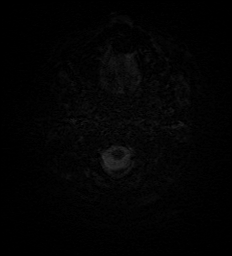
[im 20/60]
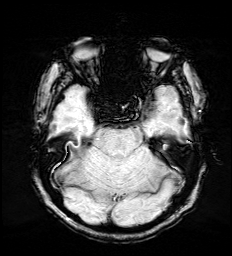
[im 40/60]
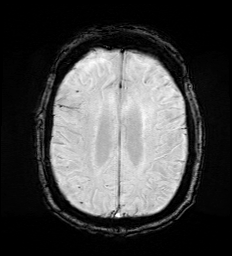
[im 60/60]
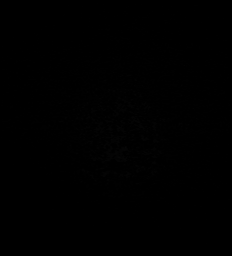

[Series 15: FLAIR · axial · 3.0mm · 0.53mm/px · z∈[-82,+77]mm · 4 of 55 slices shown]
[im 1/55]
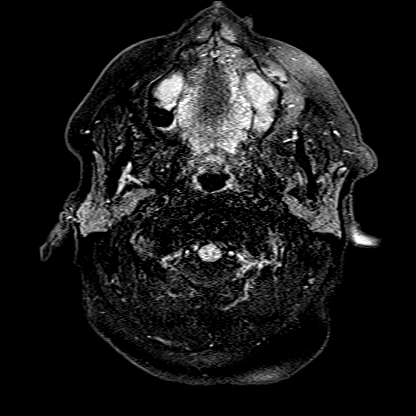
[im 19/55]
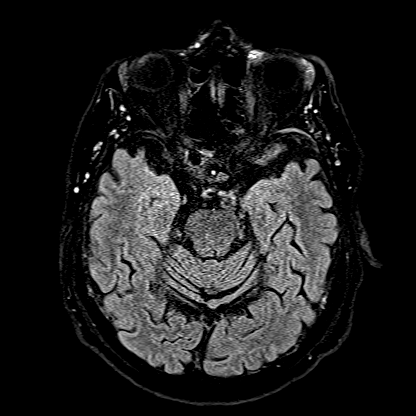
[im 37/55]
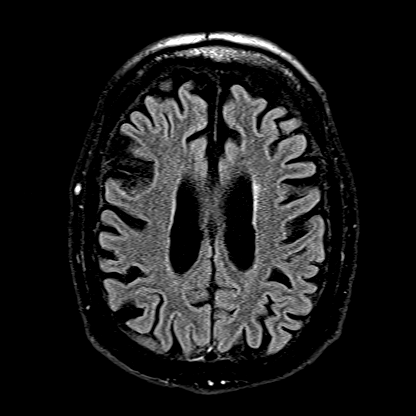
[im 55/55]
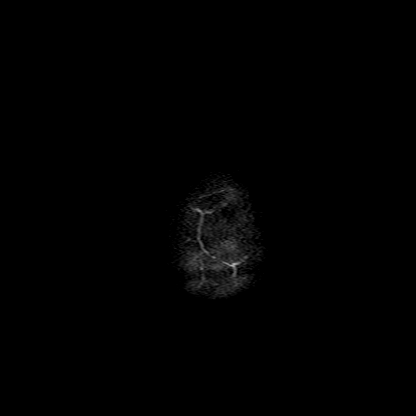

[Series 16: T1 · axial · 1.0mm · 0.98mm/px · z∈[-98,+74]mm · 9 of 176 slices shown (2 of 2)]
[im 1/176]
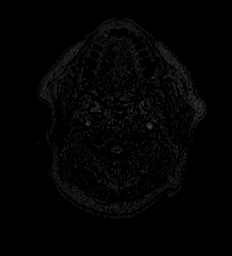
[im 15/176]
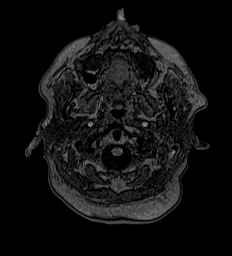
[im 30/176]
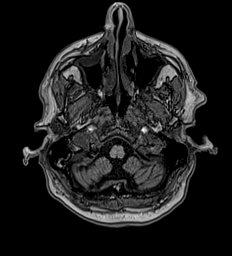
[im 59/176]
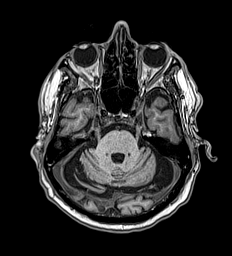
[im 73/176]
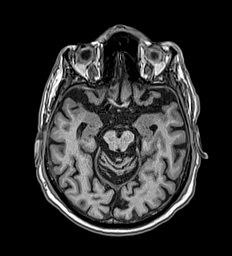
[im 103/176]
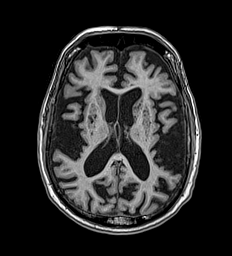
[im 117/176]
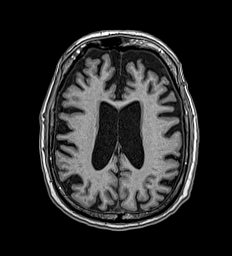
[im 146/176]
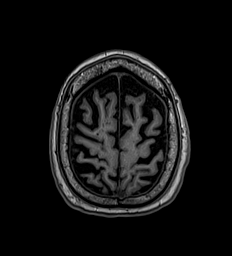
[im 176/176]
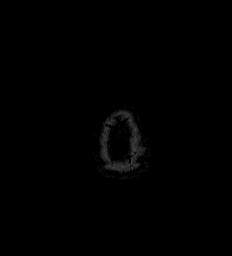

[Series 17: T2 · coronal · 5.0mm · 0.57mm/px · 2 of 29 slices shown (2 of 2)]
[im 1/29]
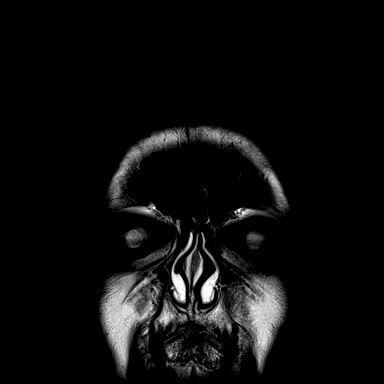
[im 29/29]
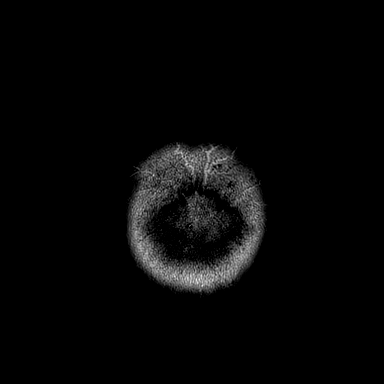

[44 of 48 positions shown; findings below may reference images not displayed]

FINDINGS: Brain: Diffusion imaging does not show any acute or subacute
infarction. No focal abnormality affects the brainstem or
cerebellum. There is generalized cerebellar volume loss.
Susceptibility weighted imaging does not show normal swallow tail
sign, which can be correlated with the diagnosis of Parkinson's
disease. Cerebral hemispheres show generalized atrophy with minimal
small vessel change of the white matter. There are numerous dilated
perivascular spaces at the base of the brain that not represent
actual ischemic infarctions. No mass, hemorrhage, hydrocephalus or
extra-axial collection.

Vascular: Major vessels at the base of the brain show flow.

Skull and upper cervical spine: Negative

Sinuses/Orbits: Clear/normal

Other: None
IMPRESSION: No acute or reversible finding.

Generalized brain atrophy. Only minimal small vessel change of the
cerebral hemispheric white matter. Prominent perivascular spaces at
the base of the brain simulated small-vessel disease by CT.

Absent swallow tail sign in the [REDACTED] regions. This can
be an indication Parkinson's disease.
